# Patient Record
Sex: Female | Born: 1950 | Race: Black or African American | Hispanic: No | Marital: Single
Health system: Midwestern US, Community
[De-identification: ages and names within clinical notes are randomized; demographics above are authoritative.]

## PROBLEM LIST (undated history)

## (undated) DIAGNOSIS — R918 Other nonspecific abnormal finding of lung field: Principal | ICD-10-CM

## (undated) DIAGNOSIS — I10 Essential (primary) hypertension: Secondary | ICD-10-CM

## (undated) DIAGNOSIS — F419 Anxiety disorder, unspecified: Secondary | ICD-10-CM

## (undated) DIAGNOSIS — K449 Diaphragmatic hernia without obstruction or gangrene: Secondary | ICD-10-CM

## (undated) DIAGNOSIS — K219 Gastro-esophageal reflux disease without esophagitis: Secondary | ICD-10-CM

## (undated) DIAGNOSIS — F410 Panic disorder [episodic paroxysmal anxiety] without agoraphobia: Secondary | ICD-10-CM

## (undated) DIAGNOSIS — D509 Iron deficiency anemia, unspecified: Secondary | ICD-10-CM

## (undated) DIAGNOSIS — IMO0002 Reserved for concepts with insufficient information to code with codable children: Secondary | ICD-10-CM

## (undated) DIAGNOSIS — C801 Malignant (primary) neoplasm, unspecified: Secondary | ICD-10-CM

## (undated) HISTORY — DX: Gastro-esophageal reflux disease without esophagitis: K21.9

## (undated) HISTORY — DX: Diaphragmatic hernia without obstruction or gangrene: K44.9

## (undated) HISTORY — DX: Essential (primary) hypertension: I10

## (undated) HISTORY — DX: Reserved for concepts with insufficient information to code with codable children: IMO0002

## (undated) HISTORY — DX: Iron deficiency anemia, unspecified: D50.9

---

## 1993-08-15 HISTORY — PX: ABDOMINAL HYSTERECTOMY: SHX81

## 2003-08-16 HISTORY — PX: APPENDECTOMY: SHX54

## 2003-08-16 HISTORY — PX: PARTIAL COLECTOMY: SHX5273

## 2003-08-16 HISTORY — PX: CHOLECYSTECTOMY: SHX55

## 2009-04-11 LAB — CBC WITH AUTOMATED DIFF
ABS. EOSINOPHILS: 0.1 10*3/uL (ref 0.0–0.4)
ABS. LYMPHOCYTES: 2.3 10*3/uL (ref 0.8–3.5)
ABS. MONOCYTES: 0.5 10*3/uL (ref 0–1.0)
ABS. NEUTROPHILS: 6.9 10*3/uL (ref 1.8–8.0)
BASOPHILS: 0 % (ref 0–3)
EOSINOPHILS: 1 % (ref 0–5)
HCT: 32.4 % — ABNORMAL LOW (ref 36.0–46.0)
HGB: 10.1 g/dL — ABNORMAL LOW (ref 12.0–16.0)
LYMPHOCYTES: 24 % (ref 20–51)
MCH: 24.1 PG — ABNORMAL LOW (ref 25.0–35.0)
MCHC: 31.3 g/dL (ref 31.0–37.0)
MCV: 77.1 FL — ABNORMAL LOW (ref 78.0–102.0)
MONOCYTES: 5 % (ref 2–9)
MPV: 7.5 FL (ref 7.4–10.4)
NEUTROPHILS: 70 % (ref 42–75)
PLATELET: 384 10*3/uL (ref 130–400)
RBC: 4.2 M/uL (ref 4.10–5.10)
RDW: 17.1 % — ABNORMAL HIGH (ref 11.5–14.5)
WBC: 9.9 10*3/uL (ref 4.5–13.0)

## 2009-04-11 LAB — GLUCOSE, RANDOM: Glucose: 94 MG/DL (ref 74–99)

## 2011-05-07 ENCOUNTER — Emergency Department (HOSPITAL_COMMUNITY)
Admission: EM | Admit: 2011-05-07 | Discharge: 2011-05-07 | Disposition: A | Discharge status: Home / Self Care | Payer: Medicare Other | Attending: Emergency Medicine | Admitting: Emergency Medicine

## 2011-05-07 DIAGNOSIS — I10 Essential (primary) hypertension: Secondary | ICD-10-CM | POA: Insufficient documentation

## 2011-05-07 DIAGNOSIS — M26609 Unspecified temporomandibular joint disorder, unspecified side: Secondary | ICD-10-CM | POA: Insufficient documentation

## 2011-05-17 ENCOUNTER — Encounter: Payer: Medicare Other | Admitting: Oncology

## 2011-06-13 ENCOUNTER — Encounter (HOSPITAL_BASED_OUTPATIENT_CLINIC_OR_DEPARTMENT_OTHER): Payer: Medicare Other | Admitting: Oncology

## 2011-06-13 ENCOUNTER — Other Ambulatory Visit: Payer: Self-pay | Admitting: Oncology

## 2011-06-13 DIAGNOSIS — C3490 Malignant neoplasm of unspecified part of unspecified bronchus or lung: Secondary | ICD-10-CM

## 2011-06-13 DIAGNOSIS — C7800 Secondary malignant neoplasm of unspecified lung: Secondary | ICD-10-CM

## 2011-06-13 DIAGNOSIS — C801 Malignant (primary) neoplasm, unspecified: Secondary | ICD-10-CM

## 2011-06-14 ENCOUNTER — Telehealth: Payer: Self-pay | Admitting: *Deleted

## 2011-08-22 ENCOUNTER — Telehealth: Payer: Self-pay | Admitting: Oncology

## 2011-08-22 NOTE — Telephone Encounter (Signed)
Pt called to state that she is unable to pick up the oraL CONTRAST prior to the exam. Advised the pt that she will need to arrive two hrs earlier to drink the oral contrast.

## 2011-08-25 ENCOUNTER — Encounter (HOSPITAL_COMMUNITY): Payer: Self-pay

## 2011-08-25 ENCOUNTER — Other Ambulatory Visit: Payer: Self-pay | Admitting: Oncology

## 2011-08-25 ENCOUNTER — Ambulatory Visit (HOSPITAL_COMMUNITY)
Admission: RE | Admit: 2011-08-25 | Discharge: 2011-08-25 | Disposition: A | Discharge status: Home / Self Care | Payer: Medicare Other | Source: Ambulatory Visit | Attending: Oncology | Admitting: Oncology

## 2011-08-25 ENCOUNTER — Other Ambulatory Visit (HOSPITAL_BASED_OUTPATIENT_CLINIC_OR_DEPARTMENT_OTHER): Payer: Medicare Other | Admitting: Lab

## 2011-08-25 DIAGNOSIS — D3500 Benign neoplasm of unspecified adrenal gland: Secondary | ICD-10-CM | POA: Insufficient documentation

## 2011-08-25 DIAGNOSIS — C3410 Malignant neoplasm of upper lobe, unspecified bronchus or lung: Secondary | ICD-10-CM

## 2011-08-25 DIAGNOSIS — Z9089 Acquired absence of other organs: Secondary | ICD-10-CM | POA: Insufficient documentation

## 2011-08-25 DIAGNOSIS — N289 Disorder of kidney and ureter, unspecified: Secondary | ICD-10-CM | POA: Insufficient documentation

## 2011-08-25 DIAGNOSIS — K449 Diaphragmatic hernia without obstruction or gangrene: Secondary | ICD-10-CM | POA: Insufficient documentation

## 2011-08-25 DIAGNOSIS — C3490 Malignant neoplasm of unspecified part of unspecified bronchus or lung: Secondary | ICD-10-CM | POA: Insufficient documentation

## 2011-08-25 HISTORY — DX: Malignant (primary) neoplasm, unspecified: C80.1

## 2011-08-25 LAB — CMP (CANCER CENTER ONLY)
AST: 14 U/L (ref 11–38)
Alkaline Phosphatase: 106 U/L — ABNORMAL HIGH (ref 26–84)
BUN, Bld: 10 mg/dL (ref 7–22)
Creat: 0.8 mg/dl (ref 0.6–1.2)

## 2011-08-25 MED ORDER — IOHEXOL 300 MG/ML  SOLN
100.0000 mL | Freq: Once | INTRAMUSCULAR | Status: AC | PRN
  Administered 2011-08-25: 100 mL via INTRAVENOUS

## 2011-08-31 ENCOUNTER — Encounter: Payer: Self-pay | Admitting: *Deleted

## 2011-09-01 ENCOUNTER — Ambulatory Visit (HOSPITAL_BASED_OUTPATIENT_CLINIC_OR_DEPARTMENT_OTHER): Payer: Medicare Other | Admitting: Oncology

## 2011-09-01 ENCOUNTER — Other Ambulatory Visit: Payer: Medicare Other | Admitting: Lab

## 2011-09-01 VITALS — BP 168/94 | HR 99 | Temp 97.3°F | Ht 66.0 in | Wt 209.1 lb

## 2011-09-01 DIAGNOSIS — C7800 Secondary malignant neoplasm of unspecified lung: Secondary | ICD-10-CM

## 2011-09-01 DIAGNOSIS — D509 Iron deficiency anemia, unspecified: Secondary | ICD-10-CM

## 2011-09-01 DIAGNOSIS — R42 Dizziness and giddiness: Secondary | ICD-10-CM

## 2011-09-01 DIAGNOSIS — C801 Malignant (primary) neoplasm, unspecified: Secondary | ICD-10-CM

## 2011-09-01 DIAGNOSIS — C7A1 Malignant poorly differentiated neuroendocrine tumors: Secondary | ICD-10-CM

## 2011-09-01 NOTE — Progress Notes (Signed)
OFFICE PROGRESS NOTE   INTERVAL HISTORY:   She returns as scheduled. She denies fever, shortness of breath, and cough. She has occasional diarrhea but this is not a consistent symptom. Her chief complaint is "vertigo ". This has been a problem for years. She reports undergoing an evaluation that included brain imaging in the past.  Objective:  Vital signs in last 24 hours:  Blood pressure 168/94, pulse 99, temperature 97.3 F (36.3 C), temperature source Oral, height 5\' 6"  (1.676 m), weight 209 lb 1.6 oz (94.847 kg).    HEENT: Neck without mass Lymphatics: No cervical, supraclavicular, axillary, or inguinal nodes Resp: Lungs clear bilaterally Cardio: Regular rate and rhythm GI: No hepatosplenomegaly, no mass Vascular: No leg edema    X-rays: CT scans of the chest abdomen and pelvis on 08/25/2011 revealed innumerable pulmonary nodules bilaterally consistent with metastatic disease. The largest nodule measured 13 mm in the left lower lobe and the largest nodule on the right lung measured 6 mm. No lymphadenopathy. 2 indeterminate left renal lesions were seen. No other evidence of metastatic disease.   Medications: I have reviewed the patient's current medications.  Assessment/Plan: 1. Metastatic neuroendocrine carcinoma involving lung nodules.  Diagnosed in July of 2011. a. Restaging CT evaluation at [facility] on 11/23/2010 revealed no change in the bilateral lung nodules compared to a CT from 01/13/2010 with no evidence of progressive metastatic disease. b. Restaging CT evaluation 08/25/2011 confirmed bilateral lung nodules and no additional evidence of metastatic disease. 2. History of a "colon polyp" resected in 2005. 3.     History of iron-deficiency anemia 4.     chronic vertigo 5.      small indeterminate left renal lesions on the CT 08/25/2011.  Disposition:  She remains asymptomatic from the metastatic neuroendocrine carcinoma. The plan is to continue an observation approach.  We've requested a direct comparison of current CT with the due to study from April of 2012. This was not done and we will make another request for a direct comparison.  She will return for an office visit in 3 months.   Lucile Shutters, MD  09/01/2011  6:13 PM

## 2011-09-01 NOTE — Progress Notes (Signed)
Called radiology file room to obtain CD of 11/23/10 CT scan from [facility] to compare with [facility] scan done on 08/25/11 and document comparison addendum report.

## 2011-09-05 ENCOUNTER — Telehealth: Payer: Self-pay | Admitting: Oncology

## 2011-09-05 NOTE — Telephone Encounter (Signed)
Faxed 09/01/11 office note to Dr. Lattie Corns at [facility]

## 2011-09-14 ENCOUNTER — Telehealth: Payer: Self-pay | Admitting: *Deleted

## 2011-09-14 NOTE — Telephone Encounter (Signed)
Patient notified. She requests copy of report and labs be mailed to her home.

## 2011-09-14 NOTE — Telephone Encounter (Signed)
Message copied by Wandalee Ferdinand on Wed Sep 14, 2011  7:00 PM ------      Message from: Ladene Artist      Created: Sun Sep 11, 2011  2:34 PM       Please call patient, lung nodules are stable or smaller compared to [facility] CT. Be sure we have report of [facility] CT.      F/u as scheduled

## 2011-12-01 ENCOUNTER — Telehealth: Payer: Self-pay | Admitting: Oncology

## 2011-12-01 ENCOUNTER — Ambulatory Visit (HOSPITAL_BASED_OUTPATIENT_CLINIC_OR_DEPARTMENT_OTHER): Payer: Medicare Other | Admitting: Oncology

## 2011-12-01 VITALS — BP 159/88 | HR 101 | Temp 98.0°F | Ht 66.0 in | Wt 213.6 lb

## 2011-12-01 DIAGNOSIS — D3A8 Other benign neuroendocrine tumors: Secondary | ICD-10-CM

## 2011-12-01 DIAGNOSIS — D3A00 Benign carcinoid tumor of unspecified site: Secondary | ICD-10-CM

## 2011-12-01 NOTE — Telephone Encounter (Signed)
appts made and printed for pt aom °

## 2011-12-01 NOTE — Progress Notes (Signed)
OFFICE PROGRESS NOTE   INTERVAL HISTORY:   She returns as scheduled. She reports recent productive cough and allergies. No fever, night sweats, flushing, or anorexia. Stable exertional dyspnea.  Objective:  Vital signs in last 24 hours:  Blood pressure 159/88, pulse 101, temperature 98 F (36.7 C), temperature source Oral, height 5\' 6"  (1.676 m), weight 213 lb 9.6 oz (96.888 kg).    HEENT: Neck without mass Lymphatics: No cervical, supraclavicular, axillary, or inguinal nodes Resp: Lungs clear bilaterally, no respiratory distress Cardio: Regular rate and rhythm GI: No hepatosplenic the, no mass, nontender Vascular: No leg edema  Lab Results: Pancreatic polypeptide-1351 on 08/25/2011    Medications: I have reviewed the patient's current medications.  Assessment/Plan: 1. Metastatic neuroendocrine carcinoma involving lung nodules.   a. Restaging CT evaluation at [facility] on 11/23/2010 revealed no change in the bilateral lung nodules compared to a CT from 01/13/2010 with no evidence of progressive metastatic disease. b. Restaging CT 09/02/2011, compared to the [facility] study from 11/23/2010 revealed a decrease in the size of some of the pulmonary nodules. An upper pole left renal lesion is unchanged, a lower indeterminate left renal lesion was not imaged on the previous study. 2. History of a "colon polyp" resected in 2005. 3. History of iron-deficiency anemia.    Disposition:  There is no clinical evidence for progression of the metastatic neuroendocrine tumor. The restaging CT in January of 2013 revealed no evidence for progression of the lung nodules. She will return for an opposite visit and repeat pancreatic polypeptide level in 4 months. We will plan a restaging CT evaluation at a one-year interval.   Thornton Papas, MD  12/01/2011  1:35 PM

## 2012-01-24 ENCOUNTER — Emergency Department (HOSPITAL_COMMUNITY)
Admission: EM | Admit: 2012-01-24 | Discharge: 2012-01-24 | Disposition: A | Discharge status: Home / Self Care | Payer: Medicare Other | Attending: Emergency Medicine | Admitting: Emergency Medicine

## 2012-01-24 ENCOUNTER — Emergency Department (HOSPITAL_COMMUNITY): Payer: Medicare Other

## 2012-01-24 ENCOUNTER — Encounter (HOSPITAL_COMMUNITY): Payer: Self-pay | Admitting: *Deleted

## 2012-01-24 DIAGNOSIS — C7A00 Malignant carcinoid tumor of unspecified site: Secondary | ICD-10-CM | POA: Insufficient documentation

## 2012-01-24 DIAGNOSIS — K449 Diaphragmatic hernia without obstruction or gangrene: Secondary | ICD-10-CM | POA: Insufficient documentation

## 2012-01-24 DIAGNOSIS — R0602 Shortness of breath: Secondary | ICD-10-CM | POA: Insufficient documentation

## 2012-01-24 DIAGNOSIS — R000 Tachycardia, unspecified: Secondary | ICD-10-CM | POA: Insufficient documentation

## 2012-01-24 DIAGNOSIS — C7B8 Other secondary neuroendocrine tumors: Secondary | ICD-10-CM | POA: Insufficient documentation

## 2012-01-24 DIAGNOSIS — I10 Essential (primary) hypertension: Secondary | ICD-10-CM | POA: Insufficient documentation

## 2012-01-24 DIAGNOSIS — R55 Syncope and collapse: Secondary | ICD-10-CM | POA: Insufficient documentation

## 2012-01-24 LAB — BASIC METABOLIC PANEL
CO2: 28 mEq/L (ref 19–32)
Calcium: 9.3 mg/dL (ref 8.4–10.5)
Creatinine, Ser: 0.91 mg/dL (ref 0.50–1.10)
GFR calc non Af Amer: 66 mL/min — ABNORMAL LOW (ref >90)
Glucose, Bld: 91 mg/dL (ref 70–99)
Sodium: 137 mEq/L (ref 135–145)

## 2012-01-24 LAB — CBC
MCH: 25.5 pg — ABNORMAL LOW (ref 26.0–34.0)
MCHC: 31.1 g/dL (ref 30.0–36.0)
MCV: 82.3 fL (ref 78.0–100.0)
Platelets: 404 10*3/uL — ABNORMAL HIGH (ref 150–400)
RBC: 4.62 MIL/uL (ref 3.87–5.11)

## 2012-01-24 MED ORDER — SODIUM CHLORIDE 0.9 % IV BOLUS (SEPSIS)
1000.0000 mL | Freq: Once | INTRAVENOUS | Status: AC
  Administered 2012-01-24: 1000 mL via INTRAVENOUS

## 2012-01-24 MED ORDER — IOHEXOL 300 MG/ML  SOLN
100.0000 mL | Freq: Once | INTRAMUSCULAR | Status: AC | PRN
  Administered 2012-01-24: 80 mL via INTRAVENOUS

## 2012-01-24 NOTE — ED Notes (Signed)
WUJ:[id number]<JY> Expected date:<BR> Expected time: 3:40 PM<BR> Means of arrival:<BR> Comments:<BR> M10 - 61yoF Near syncope, says its related to vertigo

## 2012-01-24 NOTE — Discharge Instructions (Signed)
Near-Syncope Near-syncope is sudden weakness, dizziness, or feeling like you might pass out (faint). This may occur when getting up after sitting or while standing for a long period of time. Near-syncope can be caused by a drop in blood pressure. This is a common reaction, but it may occur to a greater degree in people taking medicines to control their blood pressure. Fainting often occurs when the blood pressure or pulse is too low to provide enough blood flow to the brain to keep you conscious. Fainting and near-syncope are not usually due to serious medical problems. However, certain people should be more cautious in the event of near-syncope, including elderly patients, patients with diabetes, and patients with a history of heart conditions (especially irregular rhythms).  CAUSES   Drop in blood pressure.   Physical pain.   Dehydration.   Heat exhaustion.   Emotional distress.   Low blood sugar.   Internal bleeding.   Heart and circulatory problems.   Infections.  SYMPTOMS   Dizziness.   Feeling sick to your stomach (nauseous).   Nearly fainting.   Body numbness.   Turning pale.   Tunnel vision.   Weakness.  HOME CARE INSTRUCTIONS   Lie down right away if you start feeling like you might faint. Breathe deeply and steadily. Wait until all the symptoms have passed. Most of these episodes last only a few minutes. You may feel tired for several hours.   Drink enough fluids to keep your urine clear or pale yellow.   If you are taking blood pressure or heart medicine, get up slowly, taking several minutes to sit and then stand. This can reduce dizziness that is caused by a drop in blood pressure.  SEEK IMMEDIATE MEDICAL CARE IF:   You have a severe headache.   Unusual pain develops in the chest, abdomen, or back.   There is bleeding from the mouth or rectum, or you have black or tarry stool.   An irregular heartbeat or a very rapid pulse develops.   You have  repeated fainting or seizure-like jerking during an episode.   You faint when sitting or lying down.   You develop confusion.   You have difficulty walking.   Severe weakness develops.   Vision problems develop.  MAKE SURE YOU:   Understand these instructions.   Will watch your condition.   Will get help right away if you are not doing well or get worse.  Document Released: 08/01/2005 Document Revised: 07/21/2011 Document Reviewed: 09/17/2010 [facility] Patient Information 2012 [place], [place].  RESOURCE GUIDE  Dental Problems  Patients with Medicaid: [facility] [id number]                                           [phone] [address] Phone:  [phone]                                                   Phone:  [phone]  If unable to pay or uninsured, contact:  [facility] or [facility]. to become qualified for the adult dental clinic.  Chronic Pain Problems Contact [facility]  [phone] Patients need to be referred by their primary care doctor.  Insufficient Money for Medicine Contact United Way:  call "211" or [facility] [phone].  No Primary Care Doctor Call [facility]  [phone] Other agencies that provide inexpensive medical care    [facility]  [phone]    [facility]  [phone]    [facility]  [phone]    [facility]  [phone]    [facility]  [phone]    [facility]  [phone]  Psychological Services [facility]  [phone] [facility]  [phone] [facility]   [phone] (emergency services [phone])  Abuse/Neglect [facility] Child Abuse Hotline 8634666338 [facility] Child Abuse Hotline (769)643-4672 (After Hours)  Emergency Shelter [facility] [phone]  Maternity Homes Room at the [place] [facility] [place] [phone] [facility] [phone]  MRSA Hotline #:   724-423-7403    [facility] Resources  [facility] [place]  United [employer]                           [facility] Dept. [address] [place]                     [address]         [address]  Blondell Reveal Phone:  [phone]                                  Phone:  [phone]                   Phone:  [phone]  [facility] Phone:  [phone]  [facility] Child Abuse Hotline [phone] 7195685159 (After Hours)

## 2012-01-24 NOTE — ED Notes (Signed)
Per [facility], pt from work with reports of near syncope after bending over to pick something up off floor, became dizzy and tunnel vision that cleared during transport. Per EMS pt endorses hx of vertigo and Stage IV Lung Cancer but has not taken chemo or radiation.

## 2012-01-24 NOTE — ED Notes (Signed)
EKG given to Dr Hyman Hopes.

## 2012-01-24 NOTE — ED Notes (Signed)
Patient returned from X-ray 

## 2012-01-24 NOTE — ED Notes (Signed)
Ambulated in hall and to the bathroom---tolerated well, denies dizziness or nausea.

## 2012-01-24 NOTE — ED Provider Notes (Signed)
History     CSN: [id number]  Arrival date & time 01/24/12  1551   First MD Initiated Contact with Patient 01/24/12 1725      Chief Complaint  Patient presents with  . Near Syncope    (Consider location/radiation/quality/duration/timing/severity/associated sxs/prior treatment) HPI  61yoF h/o metastatic neuroendocrine tumor b/l lung not currently undergoing treatment, hypertension, GERD presents with near syncopal episode. The patient states she was sitting in a chair when she suddenly became lightheaded after bending over. She states that the room was becoming dark and she felt that she was going to pass out at that time. She also felt palpitations in her heart was racing. Her son took her heart rate is 120 at that time. She denies headache. She states she feels generally fatigued and weak and not completely back to her baseline but there is no lightheadedness at this time. She denies change in vision. She denies chest pain, shortness of breath. No history of venous thromboembolism in the past. Denies history of coronary artery disease. Denies hematuria/dysuria/freq/urgency    ED Notes, ED Provider Notes from 01/24/12 0000 to 01/24/12 16:05:35       Alvina Chou, RN 01/24/2012 16:04      Per [facility], pt from work with reports of near syncope after bending over to pick something up off floor, became dizzy and tunnel vision that cleared during transport. Per EMS pt endorses hx of vertigo and Stage IV Lung Cancer but has not taken chemo or radiation.         Alvina Chou, RN 01/24/2012 16:00      GEX:[id number]  Expected date:  Expected time: 3:40 PM  Means of arrival:  Comments:  M10 - 61yoF Near syncope, says its related to vertigo     Past Medical History  Diagnosis Date  . lung ca     lung ca  . Iron deficiency anemia   . Hypertension   . GERD (gastroesophageal reflux disease)   . Hiatal hernia   . Positional vertigo     Past Surgical History  Procedure Date  .  Abdominal hysterectomy 1995  . Cholecystectomy 2005  . Appendectomy 2005  . Partial colectomy 2005    History reviewed. No pertinent family history.  History  Substance Use Topics  . Smoking status: Not on file  . Smokeless tobacco: Not on file  . Alcohol Use: Not on file    OB History    Grav Para Term Preterm Abortions TAB SAB Ect Mult Living                  Review of Systems  All other systems reviewed and are negative.   except as noted HPI   Allergies  Pineapple  Home Medications   Current Outpatient Rx  Name Route Sig Dispense Refill  . AMLODIPINE BESYLATE 2.5 MG PO TABS Oral Take 2.5 mg by mouth daily.    Marland Kitchen ESTROGENS CONJUGATED 0.625 MG PO TABS Oral Take 0.625 mg by mouth daily.     Marland Kitchen LORAZEPAM 1 MG PO TABS Oral Take 1 mg by mouth daily as needed.    Marland Kitchen MECLIZINE HCL 25 MG PO TABS Oral Take 25 mg by mouth 3 (three) times daily as needed.    Marland Kitchen METRONIDAZOLE 500 MG PO TABS Oral Take 500 mg by mouth 2 (two) times daily.    Marland Kitchen OMEPRAZOLE 20 MG PO CPDR Oral Take 20 mg by mouth daily.    Marland Kitchen VITAMIN D (ERGOCALCIFEROL)  50000 UNITS PO CAPS Oral Take 50,000 Units by mouth every 7 (seven) days.      BP 149/65  Pulse 90  Temp(Src) 97.4 F (36.3 C) (Oral)  Resp 18  SpO2 99%  Physical Exam  Nursing note and vitals reviewed. Constitutional: She is oriented to person, place, and time. She appears well-developed.  HENT:  Head: Atraumatic.  Mouth/Throat: Oropharynx is clear and moist.  Eyes: Conjunctivae and EOM are normal. Pupils are equal, round, and reactive to light.  Neck: Normal range of motion. Neck supple.  Cardiovascular: Normal rate, regular rhythm, normal heart sounds and intact distal pulses.        tachycardic  Pulmonary/Chest: Effort normal and breath sounds normal. No respiratory distress. She has no wheezes. She has no rales.  Abdominal: Soft. She exhibits no distension. There is no tenderness. There is no rebound and no guarding.  Musculoskeletal:  Normal range of motion.  Neurological: She is alert and oriented to person, place, and time. No cranial nerve deficit. Coordination normal.       Strength 5/5 all extremities No pronator drift No facial droop   Skin: Skin is warm and dry. No rash noted.  Psychiatric: She has a normal mood and affect.    Date: 01/24/2012  Rate: 106  Rhythm: sinus tachycardia  QRS Axis: normal  Intervals: normal  ST/T Wave abnormalities: normal  Conduction Disutrbances:none  Narrative Interpretation:   Old EKG Reviewed: none available    ED Course  Procedures (including critical care time)  Labs Reviewed  CBC - Abnormal; Notable for the following:    WBC 12.1 (*)    Hemoglobin 11.8 (*)    MCH 25.5 (*)    Platelets 404 (*)    All other components within normal limits  BASIC METABOLIC PANEL - Abnormal; Notable for the following:    GFR calc non Af Amer 66 (*)    GFR calc Af Amer 77 (*)    All other components within normal limits   Dg Chest 2 View  01/24/2012  *RADIOLOGY REPORT*  Clinical Data: Near-syncope, shortness of breath, history of lung carcinoma  CHEST - 2 VIEW  Comparison: CT chest of 08/25/2011  Findings: There are poorly defined lung nodules bilaterally consistent with metastases as demonstrated on prior CT.  There is some volume loss at the right lung base with elevation of the right hemidiaphragm.  No effusion is seen.  Mild cardiomegaly is stable as is a moderate sized hiatal hernia.  No bony abnormality is seen.  IMPRESSION: Bilateral lung nodules consistent with metastases as previously demonstrated.  Right basilar atelectasis.  Original Report Authenticated By: Juline Patch, M.D.   Ct Head Wo Contrast  01/24/2012  *RADIOLOGY REPORT*  Clinical Data: near syncope  CT HEAD WITHOUT CONTRAST  Technique:  Contiguous axial images were obtained from the base of the skull through the vertex without contrast.  Comparison: None.  Findings:  There is diffuse patchy low density throughout  the subcortical and periventricular white matter consistent with chronic small vessel ischemic change.  There is prominence of the sulci and ventricles consistent with brain atrophy.  There is no evidence for acute brain infarct, hemorrhage or mass.  The paranasal sinuses and mastoid air cells are clear.  The skull appears intact.  IMPRESSION:  1.  No acute intracranial abnormalities. 2.  Small vessel ischemic change and brain atrophy.  Original Report Authenticated By: Rosealee Albee, M.D.   Ct Angio Chest W/cm &/or Wo Cm  01/24/2012  *RADIOLOGY REPORT*  Clinical Data: Near syncope.  History of metastatic narrow endocrine cell carcinoma.  Evaluate for pulmonary embolism.  CT ANGIOGRAPHY CHEST  Technique:  Multidetector CT imaging of the chest using the standard protocol during bolus administration of intravenous contrast. Multiplanar reconstructed images including MIPs were obtained and reviewed to evaluate the vascular anatomy.  Contrast: 80mL OMNIPAQUE IOHEXOL 300 MG/ML  SOLN  Comparison: Chest CT 08/25/2011.  Findings:  Mediastinum: There are no filling defects within the pulmonary arterial tree to suggest underlying pulmonary embolism. Heart size is normal. There is no significant pericardial fluid, thickening or pericardial calcification.  There is a large hiatal hernia. No pathologically enlarged mediastinal or hilar lymph nodes. No acute abnormality of the thoracic aorta; specifically, no aneurysm or dissection.  Lungs/Pleura: Innumerable pulmonary nodules are again seen scattered throughout the lungs bilaterally.  Many of these appear similar in size, number and distribution, including the largest nodule noted on the prior examination in the lateral aspect of the left lower lobe (image 55 of series 11) which measures 13 mm.  Some of these nodules appear cavitary and demonstrate thick walls.  In addition, there are a few new nodules, including what is now the largest lesion which measures 15 mm in  diameter (image 64 of series 11) which appears to be partially cavitary.  No acute consolidative airspace disease.  No pleural effusions.  Chronic atelectasis or scarring is noted in the left lower lobe and lateral segment of the right middle lobe.  Upper Abdomen: An exophytic low attenuation lesion extending off the upper pole of the left kidney measures at least 13 mm in diameter (similar to prior).  Musculoskeletal: There are no aggressive appearing lytic or blastic lesions noted in the visualized portions of the skeleton.  IMPRESSION: 1.  No evidence of pulmonary embolism. 2.  No acute findings in the thorax to account for the patient's symptoms. 3.  Innumerable pulmonary nodules scattered throughout the lungs bilaterally generally appear similar to the prior examination, with some exceptions as above, including a new 15 mm partially cavitary nodule in the right lower lobe posteriorly.    Per report, this represents metastatic neuroendocrine cell carcinoma.  4.  Large hiatal hernia. 5.  Additional incidental findings, as above.  Original Report Authenticated By: Florencia Reasons, M.D.     1. Near syncope       MDM  History of lung cancer presents with near syncopal episode. She is persistently tachycardic in the emergency department. Plan for CT head rule out metastasis, CT angio chest rule out pulmonary embolism, labs, IV fluids, reassess.  She states that she is feeling back to baseline. She is ambulatory without lightheadedness. CT angiogram chest without pulmonary embolism. CT head without metastasis. She is eating and feels well, ready for discharge home. She's been history precautions for return. She has a followup appointment with her primary care doctor 1 week and will call Dr. Truett Perna as needed.         Forbes Cellar, MD 01/24/12 2220

## 2012-02-05 ENCOUNTER — Emergency Department (HOSPITAL_COMMUNITY): Payer: Medicare Other

## 2012-02-05 ENCOUNTER — Encounter (HOSPITAL_COMMUNITY): Payer: Self-pay | Admitting: *Deleted

## 2012-02-05 ENCOUNTER — Emergency Department (HOSPITAL_COMMUNITY)
Admission: EM | Admit: 2012-02-05 | Discharge: 2012-02-06 | Disposition: A | Discharge status: Home / Self Care | Payer: Medicare Other | Attending: Emergency Medicine | Admitting: Emergency Medicine

## 2012-02-05 DIAGNOSIS — R5383 Other fatigue: Secondary | ICD-10-CM | POA: Insufficient documentation

## 2012-02-05 DIAGNOSIS — K219 Gastro-esophageal reflux disease without esophagitis: Secondary | ICD-10-CM | POA: Insufficient documentation

## 2012-02-05 DIAGNOSIS — R110 Nausea: Secondary | ICD-10-CM | POA: Insufficient documentation

## 2012-02-05 DIAGNOSIS — R5381 Other malaise: Secondary | ICD-10-CM | POA: Insufficient documentation

## 2012-02-05 DIAGNOSIS — R42 Dizziness and giddiness: Secondary | ICD-10-CM | POA: Insufficient documentation

## 2012-02-05 DIAGNOSIS — C3490 Malignant neoplasm of unspecified part of unspecified bronchus or lung: Secondary | ICD-10-CM | POA: Insufficient documentation

## 2012-02-05 DIAGNOSIS — I10 Essential (primary) hypertension: Secondary | ICD-10-CM | POA: Insufficient documentation

## 2012-02-05 DIAGNOSIS — Z79899 Other long term (current) drug therapy: Secondary | ICD-10-CM | POA: Insufficient documentation

## 2012-02-05 DIAGNOSIS — R55 Syncope and collapse: Secondary | ICD-10-CM | POA: Insufficient documentation

## 2012-02-05 DIAGNOSIS — R000 Tachycardia, unspecified: Secondary | ICD-10-CM | POA: Insufficient documentation

## 2012-02-05 LAB — POCT I-STAT, CHEM 8
BUN: 5 mg/dL — ABNORMAL LOW (ref 6–23)
Calcium, Ion: 1.16 mmol/L (ref 1.12–1.32)
Creatinine, Ser: 0.9 mg/dL (ref 0.50–1.10)
Hemoglobin: 13.3 g/dL (ref 12.0–15.0)
TCO2: 25 mmol/L (ref 0–100)

## 2012-02-05 LAB — URINALYSIS, ROUTINE W REFLEX MICROSCOPIC
Glucose, UA: NEGATIVE mg/dL
Hgb urine dipstick: NEGATIVE
Ketones, ur: NEGATIVE mg/dL
Protein, ur: NEGATIVE mg/dL
Urobilinogen, UA: 0.2 mg/dL (ref 0.0–1.0)

## 2012-02-05 MED ORDER — IOHEXOL 350 MG/ML SOLN
100.0000 mL | Freq: Once | INTRAVENOUS | Status: AC | PRN
  Administered 2012-02-05: 100 mL via INTRAVENOUS

## 2012-02-05 NOTE — ED Provider Notes (Signed)
I saw and evaluated the patient, reviewed the resident's note and I agree with the findings and plan.   Cheri Guppy, MD 02/06/12 [phone]

## 2012-02-05 NOTE — ED Notes (Signed)
To ED via [facility] for eval of dizziness and weakness. Hx of vertigo. [place] was [place]. Nausea. No cp.

## 2012-02-05 NOTE — ED Provider Notes (Signed)
History     CSN: [id number]  Arrival date & time 02/05/12  1749   First MD Initiated Contact with Patient 02/05/12 1859      Chief Complaint  Patient presents with  . Dizziness    Patient is a 61 y.o. female presenting with weakness. The history is provided by the patient and a relative.  Weakness The primary symptoms include dizziness (episode of dizziness of similar quality but worsened quantity compared with baseline) and nausea. Primary symptoms do not include headaches, syncope (near-syncope), altered mental status, seizures, visual change, paresthesias, focal weakness, loss of sensation, speech change, memory loss, fever or vomiting. The symptoms began less than 1 hour ago. The episode lasted 1 hour. The symptoms are improving. The neurological symptoms are diffuse. Context: Right after eating.  Dizziness also occurs with nausea and weakness. Dizziness does not occur with vomiting.  Additional symptoms include weakness. Additional symptoms do not include neck stiffness, photophobia or vertigo (at baseline). Medical issues also include cancer. Medical issues do not include seizures or cerebral vascular accident.    Past Medical History  Diagnosis Date  . lung ca     lung ca  . Iron deficiency anemia   . Hypertension   . GERD (gastroesophageal reflux disease)   . Hiatal hernia   . Positional vertigo     Past Surgical History  Procedure Date  . Abdominal hysterectomy 1995  . Cholecystectomy 2005  . Appendectomy 2005  . Partial colectomy 2005    History reviewed. No pertinent family history.  History  Substance Use Topics  . Smoking status: Not on file  . Smokeless tobacco: Not on file  . Alcohol Use: Not on file    OB History    Grav Para Term Preterm Abortions TAB SAB Ect Mult Living                  Review of Systems  Constitutional: Negative for fever and chills.  HENT: Negative for neck pain and neck stiffness.   Eyes: Negative for photophobia.    Respiratory: Negative for cough, chest tightness, shortness of breath and wheezing.   Cardiovascular: Negative for chest pain, palpitations and syncope (near-syncope).  Gastrointestinal: Positive for nausea. Negative for vomiting, abdominal pain, diarrhea and constipation.  Genitourinary: Negative for dysuria and decreased urine volume.  Skin: Negative for rash and wound.  Neurological: Positive for dizziness (episode of dizziness of similar quality but worsened quantity compared with baseline), weakness and light-headedness. Negative for vertigo (at baseline), speech change, focal weakness, seizures, facial asymmetry, headaches and paresthesias. Syncope: near syncope.  Psychiatric/Behavioral: Negative for memory loss, confusion, agitation and altered mental status.  All other systems reviewed and are negative.    Allergies  Pineapple  Home Medications   Current Outpatient Rx  Name Route Sig Dispense Refill  . AMLODIPINE BESYLATE 2.5 MG PO TABS Oral Take 2.5 mg by mouth daily.    . CHOLECALCIFEROL 400 UNITS PO TABS Oral Take 400 Units by mouth daily.    Marland Kitchen ESTROGENS CONJUGATED 0.625 MG PO TABS Oral Take 0.625 mg by mouth daily.     Marland Kitchen LORAZEPAM 1 MG PO TABS Oral Take 1 mg by mouth daily as needed.    Marland Kitchen MECLIZINE HCL 25 MG PO TABS Oral Take 25 mg by mouth 3 (three) times daily as needed. For dizziness    . OMEPRAZOLE 20 MG PO CPDR Oral Take 20 mg by mouth daily.    Marland Kitchen METRONIDAZOLE 500 MG PO TABS Oral Take  500 mg by mouth 2 (two) times daily.      BP 152/86  Pulse 119  Temp 98.5 F (36.9 C) (Oral)  Resp 20  SpO2 100%  Physical Exam  Nursing note and vitals reviewed. Constitutional: She is oriented to person, place, and time. She appears well-developed and well-nourished.  HENT:  Head: Normocephalic and atraumatic.  Right Ear: External ear normal.  Left Ear: External ear normal.  Nose: Nose normal.  Mouth/Throat: Oropharynx is clear and moist. No oropharyngeal exudate.  Eyes:  Conjunctivae are normal. Pupils are equal, round, and reactive to light.  Neck: Normal range of motion. Neck supple.  Cardiovascular: Regular rhythm, normal heart sounds and intact distal pulses.  Exam reveals no gallop and no friction rub.   No murmur heard.      Tachycardia   Pulmonary/Chest: Effort normal and breath sounds normal. No respiratory distress. She has no wheezes. She has no rales. She exhibits no tenderness.  Abdominal: Soft. Bowel sounds are normal. She exhibits no distension and no mass. There is no tenderness. There is no rebound and no guarding.  Musculoskeletal: Normal range of motion. She exhibits no edema and no tenderness.  Neurological: She is alert and oriented to person, place, and time. She displays normal reflexes. No cranial nerve deficit. She exhibits normal muscle tone. Coordination normal.  Skin: Skin is warm and dry.  Psychiatric: She has a normal mood and affect. Her behavior is normal. Judgment and thought content normal.    ED Course  Procedures (including critical care time)  Labs Reviewed  POCT I-STAT, CHEM 8 - Abnormal; Notable for the following:    BUN 5 (*)     Glucose, Bld 115 (*)     All other components within normal limits  URINALYSIS, ROUTINE W REFLEX MICROSCOPIC   Ct Angio Chest W/cm &/or Wo Cm  02/05/2012  *RADIOLOGY REPORT*  Clinical Data: 61 year old female with new onset dizziness. History of metastatic narrow endocrine tumor.  CT ANGIOGRAPHY CHEST  Technique:  Multidetector CT imaging of the chest using the standard protocol during bolus administration of intravenous contrast. Multiplanar reconstructed images including MIPs were obtained and reviewed to evaluate the vascular anatomy.  Contrast: OMNIPAQUE IOHEXOL 350 MG/ML SOLN  Comparison: 01/24/2012 and earlier.  Findings: Suboptimal but adequate contrast bolus timing in the pulmonary arterial tree, similar to the recent comparison.  No central pulmonary artery filling defect.  Major  bilateral pulmonary artery branches appear normally opacified.  No evidence of acute pulmonary embolus identified.  Large hiatal hernia containing mostly mesenteric fat re-identified and stable.  No pericardial or pleural effusion.  Stable and negative visualized thoracic inlet.  Stable small mediastinal and hilar lymph nodes.  Stable visualized upper abdominal viscera including hepatic steatosis and exophytic left renal lesion.  Left adrenal myelolipoma again noted.  Major airways are patent.  Numerous bilateral pulmonary nodules with a lower lobe predominance are re-identified ranging from punctate to 15 mm diameter (the largest in the bilateral lower lobes ).  No significant interval change, some cavitation of the right lower lobe nodules is again noted (series 5 image 55). Superimposed chronic retrocardiac and lower lobe atelectasis is stable.  No acute osseous abnormality identified.  IMPRESSION: 1.  Suboptimal contrast bolus timing but no evidence of acute pulmonary embolism. 2.  Stable widespread pulmonary nodules compatible with metastatic disease to the lungs.  No significant change since 01/24/2012.  Original Report Authenticated By: Harley Hallmark, M.D.     1. 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 Patient does not have systemic signs/symptoms of infection. AFVSS. No localizing neuro signs on exam and no neuro complaints. No evidence of neurologic or cardiac causes of near syncope. Because patient has resting tachycardia and risk factors for PE, chest CTA ordered and negative for evidence of PE. Patient states she is feeling better and given return precautions, including worsening of signs or symptoms. Patient instructed to follow-up with primary care physician.          Clemetine Marker, MD 02/06/12 [phone]

## 2012-02-05 NOTE — ED Notes (Signed)
Error charting wrong pt disregard care handoff

## 2012-02-05 NOTE — Discharge Instructions (Signed)
Your CAT scan does not show evidence of a blood clot in your lungs.  Your blood tests, not show any signs of anemia.  At this time.  Followup with your Dr. for reevaluation.  If your symptoms persist.  Return for worse or uncontrolled symptoms

## 2012-02-05 NOTE — ED Notes (Signed)
Pt complains of dizziness, pt sts that she was at dinner theatre and became dizzy, pt has a history of vertigo and sts that this feels the same but more intense.

## 2012-02-05 NOTE — ED Notes (Signed)
MD in the spoke to pt discharge on hold for now

## 2012-02-06 ENCOUNTER — Telehealth: Payer: Self-pay | Admitting: *Deleted

## 2012-02-06 NOTE — Telephone Encounter (Signed)
Returned call to pt. Per Dr. Truett Perna: It is not likely that her cancer diagnosis is causing hypertension or dizziness. Follow up with Primary Care MD for this. Pt states she has never been told she had low platelet count before.

## 2012-02-06 NOTE — Telephone Encounter (Signed)
Recent ER visit for "dizzy spells" and hypertension. Asking how much her neuroendocrine tumor cancer diagnosis could contribute to this?

## 2012-02-10 ENCOUNTER — Telehealth: Payer: Self-pay | Admitting: *Deleted

## 2012-02-10 NOTE — Telephone Encounter (Signed)
Platelet count 6/11=404 and labs faxed from [facility] 02/01/12 have platelets at 68. Left VM for patient to call and let office know if she is having any chemotherapy per [facility] for her cancer to explain drop or if it could be lab error.  Also MD asking what specific information she is requesting in the letter she has requested he dictate.

## 2012-02-17 ENCOUNTER — Other Ambulatory Visit: Payer: Self-pay | Admitting: *Deleted

## 2012-02-17 ENCOUNTER — Telehealth: Payer: Self-pay | Admitting: *Deleted

## 2012-02-17 DIAGNOSIS — C801 Malignant (primary) neoplasm, unspecified: Secondary | ICD-10-CM

## 2012-02-17 NOTE — Telephone Encounter (Signed)
Labs from PCP showed platelet count much lower than she has been in this office. Needs to recheck per Dr. Truett Perna. She agrees to come in on 7/9 for recheck. Unable to come Monday due to transportation. Denies any bleeding or bruising. Also made her aware that Dr. Truett Perna doubts that her dizzy spells and hypertension are related to her neuroendocrine cancer. Suggests she follow up with her PCP about this issue.

## 2012-02-21 ENCOUNTER — Other Ambulatory Visit (HOSPITAL_BASED_OUTPATIENT_CLINIC_OR_DEPARTMENT_OTHER): Payer: Medicare Other | Admitting: Lab

## 2012-02-21 ENCOUNTER — Other Ambulatory Visit: Payer: Medicare Other | Admitting: Lab

## 2012-02-21 ENCOUNTER — Other Ambulatory Visit: Payer: Self-pay | Admitting: *Deleted

## 2012-02-21 DIAGNOSIS — C7A090 Malignant carcinoid tumor of the bronchus and lung: Secondary | ICD-10-CM

## 2012-02-21 DIAGNOSIS — C801 Malignant (primary) neoplasm, unspecified: Secondary | ICD-10-CM

## 2012-02-21 DIAGNOSIS — D3A8 Other benign neuroendocrine tumors: Secondary | ICD-10-CM

## 2012-02-21 LAB — CBC WITH DIFFERENTIAL/PLATELET
BASO%: 0.3 % (ref 0.0–2.0)
EOS%: 2 % (ref 0.0–7.0)
LYMPH%: 32.8 % (ref 14.0–49.7)
MCH: 24.8 pg — ABNORMAL LOW (ref 25.1–34.0)
MCHC: 31.3 g/dL — ABNORMAL LOW (ref 31.5–36.0)
MCV: 79.3 fL — ABNORMAL LOW (ref 79.5–101.0)
MONO%: 8.9 % (ref 0.0–14.0)
Platelets: 354 10*3/uL (ref 145–400)
RBC: 4.55 10*6/uL (ref 3.70–5.45)
nRBC: 0 % (ref 0–0)

## 2012-02-23 ENCOUNTER — Other Ambulatory Visit: Payer: Self-pay | Admitting: *Deleted

## 2012-02-23 DIAGNOSIS — D3A00 Benign carcinoid tumor of unspecified site: Secondary | ICD-10-CM

## 2012-02-24 ENCOUNTER — Telehealth: Payer: Self-pay | Admitting: Oncology

## 2012-02-24 NOTE — Telephone Encounter (Signed)
lmonvm adviisng the pt that she had to be fasting after midnite for her lab appt on 02/29/2012 per md orders

## 2012-02-28 ENCOUNTER — Other Ambulatory Visit: Payer: Self-pay | Admitting: *Deleted

## 2012-02-28 DIAGNOSIS — D3A00 Benign carcinoid tumor of unspecified site: Secondary | ICD-10-CM

## 2012-02-29 ENCOUNTER — Other Ambulatory Visit (HOSPITAL_BASED_OUTPATIENT_CLINIC_OR_DEPARTMENT_OTHER): Payer: Medicare Other | Admitting: Lab

## 2012-02-29 DIAGNOSIS — C7A00 Malignant carcinoid tumor of unspecified site: Secondary | ICD-10-CM

## 2012-02-29 DIAGNOSIS — C7A090 Malignant carcinoid tumor of the bronchus and lung: Secondary | ICD-10-CM

## 2012-02-29 DIAGNOSIS — D3A00 Benign carcinoid tumor of unspecified site: Secondary | ICD-10-CM

## 2012-03-14 ENCOUNTER — Telehealth: Payer: Self-pay | Admitting: *Deleted

## 2012-03-14 NOTE — Telephone Encounter (Signed)
Call from pt reporting she has a cold and has lost her voice. Temp 99.0. Asking if there is anything she can take for this. Informed her that this will likely run its course. OK to take OTC cold remedy as needed. She voiced understanding.

## 2012-03-14 NOTE — Telephone Encounter (Signed)
Pt returned call, she had faxed a request for a letter to be dictated stating her "definitive diagnosis" and that her cancer is in her lungs. Request to Dr. Truett Perna.

## 2012-03-14 NOTE — Telephone Encounter (Signed)
Called pt, please call office re: letter she requested.

## 2012-03-18 ENCOUNTER — Encounter: Payer: Self-pay | Admitting: Oncology

## 2012-04-03 ENCOUNTER — Telehealth: Payer: Self-pay | Admitting: Oncology

## 2012-04-03 ENCOUNTER — Telehealth: Payer: Self-pay | Admitting: *Deleted

## 2012-04-03 ENCOUNTER — Ambulatory Visit (HOSPITAL_BASED_OUTPATIENT_CLINIC_OR_DEPARTMENT_OTHER): Payer: Medicare Other | Admitting: Oncology

## 2012-04-03 VITALS — BP 158/85 | HR 106 | Temp 98.9°F | Resp 18 | Ht 66.0 in | Wt 210.4 lb

## 2012-04-03 DIAGNOSIS — C7B8 Other secondary neuroendocrine tumors: Secondary | ICD-10-CM

## 2012-04-03 DIAGNOSIS — C7A8 Other malignant neuroendocrine tumors: Secondary | ICD-10-CM

## 2012-04-03 DIAGNOSIS — C7A00 Malignant carcinoid tumor of unspecified site: Secondary | ICD-10-CM

## 2012-04-03 NOTE — Progress Notes (Signed)
   [facility]    OFFICE PROGRESS NOTE   INTERVAL HISTORY:   She returns as scheduled. She reports a "cold "for the past 2 weeks with hoarseness and a sore throat. She has chronic exertional dyspnea. She is now being followed by Dr. Clent Ridges for management of hypertension. She was seen in the emergency room on 02/05/2012 with "dizziness ". A CT of the chest revealed stable pulmonary nodules. No evidence of acute pulmonary embolism. She was seen in the emergency room on 01/24/2012 with similar symptoms and a brain CT was negative for metastatic disease. A CT the chest was negative for pulmonary and was him. The chest CT on 01/24/2012 revealed innumerable pulmonary nodules with a few new nodules including a 15 mm lesion in the right lower lobe.  She complains of malaise. She has a good appetite. No fever. Occasional episode of flushing. No diarrhea.  Objective:  Vital signs in last 24 hours:  Blood pressure 158/85, pulse 106, temperature 98.9 F (37.2 C), temperature source Oral, resp. rate 18, height 5\' 6"  (1.676 m), weight 210 lb 6.4 oz (95.437 kg).    HEENT: Neck without mass, pharynx without erythema Lymphatics: No cervical, supraclavicular, axillary, or inguinal nodes Resp: Lungs clear bilaterally, no respiratory distress Cardio: Regular rate and rhythm GI: No hepatosplenomegaly, no mass Vascular: No leg edema   Lab Results:  Lab Results  Component Value Date   WBC 11.0* 02/21/2012   HGB 11.3* 02/21/2012   HCT 36.1 02/21/2012   MCV 79.3* 02/21/2012   PLT 354 02/21/2012   ferritin 15 on 02/29/2012  Pancreatic polypeptide 423 (100-780) on 02/29/2012   Medications: I have reviewed the patient's current medications.  Assessment/Plan: 1. Metastatic neuroendocrine carcinoma involving lung nodules.  a. Restaging CT evaluation at [facility] on 11/23/2010 revealed no change in the bilateral lung nodules compared to a CT from 01/13/2010 with no evidence of progressive metastatic  disease. b. Restaging CT 09/02/2011, compared to the [facility] study from 11/23/2010 revealed a decrease in the size of some of the pulmonary nodules. An upper pole left renal lesion is unchanged, a lower indeterminate left renal lesion was not imaged on the previous study. c. CT the chest 01/24/2012 with overall stable lung nodules and a few new lesions, CT of the chest on 02/05/2012 unchanged d. Normal pancreatic polypeptide 02/29/2012 2. History of a "colon polyp" resected in 2005. 3. History of iron-deficiency anemia. Mild anemia with a low ferritin level in July 2013, she reports undergoing multiple colonoscopy examinations over the past few years for evaluation of iron deficiency 4. Hypertension-managed by Dr. Clent Ridges  Disposition:  She appears asymptomatic from the metastatic neuroendocrine carcinoma. The pancreatic polypeptide was lower in July and a CT the chest showed a few new lung nodules. The plan is to continue an observation approach. She will return for an office and lab visit in 3 months. We will schedule a restaging CT of the chest when she returns in 3 months.   Thornton Papas, MD  04/03/2012  11:24 AM

## 2012-04-03 NOTE — Telephone Encounter (Signed)
Left message on voicemail for pt to call office. (Dr. Truett Perna ordered CT, to follow up lung nodules.)

## 2012-04-03 NOTE — Telephone Encounter (Signed)
lmonvm advising the pt of her lab and ct scan appt in nov

## 2012-04-03 NOTE — Telephone Encounter (Signed)
gve the pt her nov 2013 appt calendar °

## 2012-05-09 ENCOUNTER — Telehealth: Payer: Self-pay | Admitting: Oncology

## 2012-05-09 ENCOUNTER — Telehealth: Payer: Self-pay | Admitting: *Deleted

## 2012-05-09 NOTE — Telephone Encounter (Signed)
Talked to patient , gave her appt date for November 2013 lab and Ct then MD visit on 07/05/12

## 2012-05-09 NOTE — Telephone Encounter (Signed)
Received call from pt "having questions about my schedule"  Returned call and spoke with pt explaining that 07/03/12 Lab at 9:30 then CT scan at 10:30 then 11/21 MD appt.   Pt stated she was confused about lab, but able to voice back correct date/time for appts.

## 2012-06-25 ENCOUNTER — Other Ambulatory Visit: Payer: Self-pay | Admitting: Internal Medicine

## 2012-06-25 DIAGNOSIS — Z1231 Encounter for screening mammogram for malignant neoplasm of breast: Secondary | ICD-10-CM

## 2012-07-03 ENCOUNTER — Other Ambulatory Visit (HOSPITAL_BASED_OUTPATIENT_CLINIC_OR_DEPARTMENT_OTHER): Payer: Medicare Other | Admitting: Lab

## 2012-07-03 ENCOUNTER — Ambulatory Visit (HOSPITAL_COMMUNITY)
Admission: RE | Admit: 2012-07-03 | Discharge: 2012-07-03 | Disposition: A | Discharge status: Home / Self Care | Payer: Medicare Other | Source: Ambulatory Visit | Attending: Oncology | Admitting: Oncology

## 2012-07-03 DIAGNOSIS — C7A00 Malignant carcinoid tumor of unspecified site: Secondary | ICD-10-CM | POA: Insufficient documentation

## 2012-07-03 DIAGNOSIS — K7689 Other specified diseases of liver: Secondary | ICD-10-CM | POA: Insufficient documentation

## 2012-07-03 DIAGNOSIS — C7A8 Other malignant neuroendocrine tumors: Secondary | ICD-10-CM

## 2012-07-03 DIAGNOSIS — K449 Diaphragmatic hernia without obstruction or gangrene: Secondary | ICD-10-CM | POA: Insufficient documentation

## 2012-07-03 DIAGNOSIS — C7B8 Other secondary neuroendocrine tumors: Secondary | ICD-10-CM | POA: Insufficient documentation

## 2012-07-03 LAB — CBC WITH DIFFERENTIAL/PLATELET
Basophils Absolute: 0 10*3/uL (ref 0.0–0.1)
HCT: 33 % — ABNORMAL LOW (ref 34.8–46.6)
HGB: 10.1 g/dL — ABNORMAL LOW (ref 11.6–15.9)
MONO#: 0.5 10*3/uL (ref 0.1–0.9)
NEUT%: 72.2 % (ref 38.4–76.8)
Platelets: 394 10*3/uL (ref 145–400)
WBC: 10.8 10*3/uL — ABNORMAL HIGH (ref 3.9–10.3)
lymph#: 2.3 10*3/uL (ref 0.9–3.3)

## 2012-07-05 ENCOUNTER — Other Ambulatory Visit: Payer: Medicare Other | Admitting: Lab

## 2012-07-05 ENCOUNTER — Ambulatory Visit (HOSPITAL_BASED_OUTPATIENT_CLINIC_OR_DEPARTMENT_OTHER): Payer: Medicare Other | Admitting: Oncology

## 2012-07-05 ENCOUNTER — Telehealth: Payer: Self-pay | Admitting: Oncology

## 2012-07-05 DIAGNOSIS — C7B8 Other secondary neuroendocrine tumors: Secondary | ICD-10-CM

## 2012-07-05 DIAGNOSIS — N289 Disorder of kidney and ureter, unspecified: Secondary | ICD-10-CM

## 2012-07-05 DIAGNOSIS — D649 Anemia, unspecified: Secondary | ICD-10-CM

## 2012-07-05 DIAGNOSIS — C7A00 Malignant carcinoid tumor of unspecified site: Secondary | ICD-10-CM

## 2012-07-05 NOTE — Telephone Encounter (Signed)
Gave pt appt for May 2014 lab and MD °

## 2012-07-05 NOTE — Patient Instructions (Signed)
Start ferrous sulfate 325mg  twice daily

## 2012-07-05 NOTE — Progress Notes (Signed)
   [facility]    OFFICE PROGRESS NOTE   INTERVAL HISTORY:   She returns as scheduled. She complains of malaise and intermittent "dizziness ". No bleeding.  Objective:  Vital signs in last 24 hours:  There were no vitals taken for this visit.    HEENT: Neck without mass Lymphatics: No cervical, supraclavicular, axillary, or inguinal nodes Resp: Lungs clear bilaterally Cardio: Regular rate and rhythm GI: No hepatosplenomegaly, no mass, nontender Vascular: No leg edema Neuro: The face is symmetric, extraocular movements are intact, finger to nose testing is normal    Lab Results:  Lab Results  Component Value Date   WBC 10.8* 07/03/2012   HGB 10.1* 07/03/2012   HCT 33.0* 07/03/2012   MCV 74.0* 07/03/2012   PLT 394 07/03/2012   ANC 7.8 Ferritin on 07/03/2012-8  X-rays-CT the chest on 07/03/2012: numerous bilateral pulmonary nodules are unchanged compared to a CT from 02/05/2012.  Medications: I have reviewed the patient's current medications.  Assessment/Plan: 1. Metastatic neuroendocrine carcinoma involving lung nodules.  a. Restaging CT evaluation at [facility] on 11/23/2010 revealed no change in the bilateral lung nodules compared to a CT from 01/13/2010 with no evidence of progressive metastatic disease. b. Restaging CT 09/02/2011, compared to the [facility] study from 11/23/2010 revealed a decrease in the size of some of the pulmonary nodules. An upper pole left renal lesion is unchanged, a lower indeterminate left renal lesion was not imaged on the previous study. c. CT the chest 01/24/2012 with overall stable lung nodules and a few new lesions, CT of the chest on 02/05/2012 unchanged d. Normal pancreatic polypeptide 02/29/2012 e. CT the chest 07/03/2012-no change in the bilateral lung nodules 2. History of a "colon polyp" resected in 2005. 3. History of iron-deficiency anemia. Mild anemia with a low ferritin level in July 2013, she reports undergoing multiple  colonoscopy examinations over the past few years for evaluation of iron deficiency. She appears to have iron deficiency anemia. She will begin iron therapy. 4. Hypertension-managed by Dr. Clent Ridges 5. "Dizziness "-this is most likely not related to the metastatic neuroendocrine tumor.  Disposition:  Ms. Ashley Farmer appears stable from an oncology standpoint. She will begin iron therapy and return for a CBC in 4-5 weeks. She is scheduled for a 3 month office visit. She reports undergoing an upper endoscopy and colonoscopy for evaluation of anemia approximately 2 years ago. We will ask her to bring [place] a copy of these reports.     Thornton Papas, MD  07/05/2012  10:33 AM

## 2012-07-06 ENCOUNTER — Telehealth: Payer: Self-pay | Admitting: *Deleted

## 2012-07-06 NOTE — Telephone Encounter (Signed)
Patient called to request copy of her last CT scan and labs be sent to her. Forwarded request to HIM department.  Dr. Truett Perna is requesting patient to bring most recent EGD & colonoscopy reports to him, or attempt to locate these in past records.

## 2012-07-10 LAB — PANCREATIC POLYPEPTIDE

## 2012-07-30 ENCOUNTER — Ambulatory Visit
Admission: RE | Admit: 2012-07-30 | Discharge: 2012-07-30 | Disposition: A | Discharge status: Home / Self Care | Payer: Medicare Other | Source: Ambulatory Visit | Attending: Internal Medicine | Admitting: Internal Medicine

## 2012-07-30 DIAGNOSIS — Z1231 Encounter for screening mammogram for malignant neoplasm of breast: Secondary | ICD-10-CM

## 2012-08-01 ENCOUNTER — Ambulatory Visit: Payer: Medicare Other | Admitting: Nurse Practitioner

## 2012-10-05 ENCOUNTER — Other Ambulatory Visit (HOSPITAL_BASED_OUTPATIENT_CLINIC_OR_DEPARTMENT_OTHER): Payer: Medicare Other | Admitting: Lab

## 2012-10-05 ENCOUNTER — Telehealth: Payer: Self-pay | Admitting: Oncology

## 2012-10-05 ENCOUNTER — Ambulatory Visit (HOSPITAL_BASED_OUTPATIENT_CLINIC_OR_DEPARTMENT_OTHER): Payer: Medicare Other | Admitting: Oncology

## 2012-10-05 ENCOUNTER — Other Ambulatory Visit: Payer: Self-pay | Admitting: Oncology

## 2012-10-05 VITALS — BP 142/72 | HR 75 | Temp 98.0°F | Resp 20 | Ht 66.0 in | Wt 206.5 lb

## 2012-10-05 DIAGNOSIS — R911 Solitary pulmonary nodule: Secondary | ICD-10-CM

## 2012-10-05 DIAGNOSIS — D509 Iron deficiency anemia, unspecified: Secondary | ICD-10-CM

## 2012-10-05 DIAGNOSIS — D649 Anemia, unspecified: Secondary | ICD-10-CM

## 2012-10-05 DIAGNOSIS — C7A00 Malignant carcinoid tumor of unspecified site: Secondary | ICD-10-CM

## 2012-10-05 DIAGNOSIS — I10 Essential (primary) hypertension: Secondary | ICD-10-CM

## 2012-10-05 LAB — CBC WITH DIFFERENTIAL/PLATELET
BASO%: 0.3 % (ref 0.0–2.0)
Basophils Absolute: 0 10*3/uL (ref 0.0–0.1)
EOS%: 2.7 % (ref 0.0–7.0)
HCT: 35.5 % (ref 34.8–46.6)
HGB: 10.9 g/dL — ABNORMAL LOW (ref 11.6–15.9)
LYMPH%: 29.5 % (ref 14.0–49.7)
MCH: 23 pg — ABNORMAL LOW (ref 25.1–34.0)
MCHC: 30.7 g/dL — ABNORMAL LOW (ref 31.5–36.0)
MCV: 75.1 fL — ABNORMAL LOW (ref 79.5–101.0)
MONO%: 8.2 % (ref 0.0–14.0)
NEUT%: 59.3 % (ref 38.4–76.8)
Platelets: 378 10*3/uL (ref 145–400)

## 2012-10-05 NOTE — Patient Instructions (Signed)
Increase iron to twice daily

## 2012-10-05 NOTE — Telephone Encounter (Signed)
Gave pt appt for lab and MD on May 2014 °

## 2012-10-05 NOTE — Progress Notes (Signed)
   [facility]    OFFICE PROGRESS NOTE   INTERVAL HISTORY:   She returns as scheduled. She is taking iron once daily. No bleeding. She recently had "pink eye ", and upper respiratory infection, and a GI virus. She continues to have fullness in the right ear. Good appetite and energy level. No dyspnea.  Objective:  Vital signs in last 24 hours:  Blood pressure 142/72, pulse 75, temperature 98 F (36.7 C), temperature source Oral, resp. rate 20, height 5\' 6"  (1.676 m), weight 206 lb 8 oz (93.668 kg).    HEENT: Neck without mass. Mild cerumen in the right external canal, good light reflex Lymphatics: No cervical, supra-clavicular, axillary, or inguinal nodes Resp: Lungs clear bilaterally Cardio: Regular rate and rhythm GI: Nontender, no hepatosplenomegaly Vascular: No leg edema   Lab Results:  Lab Results  Component Value Date   WBC 8.0 10/05/2012   HGB 10.9* 10/05/2012   HCT 35.5 10/05/2012   MCV 75.1* 10/05/2012   PLT 378 10/05/2012   ANC 4.7  Ferritin-8 on 07/03/2012   Medications: I have reviewed the patient's current medications.  Assessment/Plan: 1. Metastatic neuroendocrine carcinoma involving lung nodules.  a. Restaging CT evaluation at [facility] on 11/23/2010 revealed no change in the bilateral lung nodules compared to a CT from 01/13/2010 with no evidence of progressive metastatic disease. b. Restaging CT 09/02/2011, compared to the [facility] study from 11/23/2010 revealed a decrease in the size of some of the pulmonary nodules. An upper pole left renal lesion is unchanged, a lower indeterminate left renal lesion was not imaged on the previous study. c. CT the chest 01/24/2012 with overall stable lung nodules and a few new lesions, CT of the chest on 02/05/2012 unchanged d. Normal pancreatic polypeptide 02/29/2012 e. CT the chest 07/03/2012-no change in the bilateral lung nodules 2. History of a "colon polyp" resected in 2005. 3. History of iron-deficiency  anemia. Mild anemia with a low ferritin level in July 2013 and November 2013, she reports undergoing multiple colonoscopy examinations over the past few years for evaluation of iron deficiency. The anemia has partially improved with iron.  4. Hypertension-managed by Dr. Clent Ridges   Disposition:  She appears stable. I recommended she increase the ferrous sulfate to twice daily. She will return for an office visit and CBC in 3 months. She reports a chronic history of iron deficiency. She will bring [place] a report of upper/lower endoscopy procedures that were performed within the past few years. The pancreatic polypeptide was repeated today.   Thornton Papas, MD  10/05/2012  10:17 AM

## 2012-10-20 LAB — PANCREATIC POLYPEPTIDE

## 2012-11-09 ENCOUNTER — Telehealth: Payer: Self-pay | Admitting: *Deleted

## 2012-11-09 NOTE — Telephone Encounter (Signed)
Left message on voicemail for pt to call office for lab results. (Pancreatic polypeptide results are normal, per Dr. Truett Perna.)

## 2012-11-26 NOTE — Telephone Encounter (Signed)
Pt called regarding message left 11/09/12.  Pt informed of lab results; verbalized understanding and confirmed appt for 01/03/13.

## 2013-01-02 ENCOUNTER — Telehealth: Payer: Self-pay | Admitting: Oncology

## 2013-01-02 NOTE — Telephone Encounter (Signed)
Returned pt's call re cx'ing 5/22 appt. Per pt she is going out of town and will call back to r/s. Message to desk nurse.

## 2013-01-03 ENCOUNTER — Other Ambulatory Visit: Payer: Medicare Other | Admitting: Lab

## 2013-01-03 ENCOUNTER — Ambulatory Visit: Payer: Medicare Other | Admitting: Oncology

## 2013-03-18 ENCOUNTER — Telehealth: Payer: Self-pay | Admitting: *Deleted

## 2013-03-18 NOTE — Telephone Encounter (Signed)
Pt called wanting a lab appt w/ ov. gv appt for 04/30/13 @3 :30pm. However i did not schedule an lab. i emailed GBS informing him that i needed new lab orders...td

## 2013-03-20 ENCOUNTER — Telehealth: Payer: Self-pay | Admitting: *Deleted

## 2013-03-20 NOTE — Telephone Encounter (Signed)
sw pt gv lab appt for 04/30/13 @ 3pm...td

## 2013-04-30 ENCOUNTER — Other Ambulatory Visit (HOSPITAL_BASED_OUTPATIENT_CLINIC_OR_DEPARTMENT_OTHER): Payer: Medicare Other | Admitting: Lab

## 2013-04-30 ENCOUNTER — Ambulatory Visit (HOSPITAL_BASED_OUTPATIENT_CLINIC_OR_DEPARTMENT_OTHER): Payer: Medicare Other | Admitting: Oncology

## 2013-04-30 ENCOUNTER — Telehealth: Payer: Self-pay | Admitting: Oncology

## 2013-04-30 VITALS — BP 156/57 | HR 95 | Temp 98.7°F | Resp 20 | Ht 66.0 in | Wt 211.2 lb

## 2013-04-30 DIAGNOSIS — D649 Anemia, unspecified: Secondary | ICD-10-CM

## 2013-04-30 DIAGNOSIS — C7A1 Malignant poorly differentiated neuroendocrine tumors: Secondary | ICD-10-CM

## 2013-04-30 LAB — CBC WITH DIFFERENTIAL/PLATELET
BASO%: 0.4 % (ref 0.0–2.0)
Basophils Absolute: 0.1 10*3/uL (ref 0.0–0.1)
EOS%: 1.2 % (ref 0.0–7.0)
HGB: 8.8 g/dL — ABNORMAL LOW (ref 11.6–15.9)
MCH: 20.5 pg — ABNORMAL LOW (ref 25.1–34.0)
MCHC: 29.5 g/dL — ABNORMAL LOW (ref 31.5–36.0)
MCV: 69.4 fL — ABNORMAL LOW (ref 79.5–101.0)
MONO%: 7.3 % (ref 0.0–14.0)
RDW: 17.9 % — ABNORMAL HIGH (ref 11.2–14.5)
lymph#: 3.6 10*3/uL — ABNORMAL HIGH (ref 0.9–3.3)

## 2013-04-30 NOTE — Telephone Encounter (Signed)
Gave pt appt for lab and MD on 10/13,Ct same day

## 2013-04-30 NOTE — Progress Notes (Signed)
   [facility]    OFFICE PROGRESS NOTE   INTERVAL HISTORY:   She returns as scheduled. She reports an increased cough productive of a clear sputum. She is now taking iron. No bleeding. She reports malaise. She has not forwarded previous colonoscopy reports to [place]. She reports undergoing an extensive GI evaluations in [place] in [place] including upper/lower endoscopies and a camera endoscopy.  Objective:  Vital signs in last 24 hours:  Blood pressure 156/57, pulse 95, temperature 98.7 F (37.1 C), temperature source Oral, resp. rate 20, height 5\' 6"  (1.676 m), weight 211 lb 3.2 oz (95.8 kg).    HEENT: Neck without mass Lymphatics: No cervical, supra-clavicular, axillary, or inguinal nodes Resp: Lungs clear bilaterally Cardio: Regular rate and rhythm GI: No hepatomegaly, no mass Vascular: No leg edema   Lab Results:  Lab Results  Component Value Date   WBC 13.1* 04/30/2013   HGB 8.8* 04/30/2013   HCT 29.9* 04/30/2013   MCV 69.4* 04/30/2013   PLT 430* 04/30/2013   ANC 8.3   Medications: I have reviewed the patient's current medications.  Assessment/Plan: 1. Metastatic neuroendocrine carcinoma involving lung nodules.  a. Restaging CT evaluation at [facility] on 11/23/2010 revealed no change in the bilateral lung nodules compared to a CT from 01/13/2010 with no evidence of progressive metastatic disease. b. Restaging CT 09/02/2011, compared to the [facility] study from 11/23/2010 revealed a decrease in the size of some of the pulmonary nodules. An upper pole left renal lesion is unchanged, a lower indeterminate left renal lesion was not imaged on the previous study. c. CT the chest 01/24/2012 with overall stable lung nodules and a few new lesions, CT of the chest on 02/05/2012 unchanged d. Normal pancreatic polypeptide 02/29/2012  e. CT the chest 07/03/2012-no change in the bilateral lung nodules f. Normal pancreatic polypeptide 10/05/2012 2. History of a "colon polyp"  resected in 2005. 3. History of iron-deficiency anemia. Mild anemia with a low ferritin level in July 2013 and November 2013, she reports undergoing multiple colonoscopy examinations over the past few years for evaluation of iron deficiency. The anemia partially improved with iron and is now lower. She is no longer taking iron. 4. Hypertension-managed by Dr. Clent Ridges  Disposition:  The anemia has progressed. I recommended she begin ferrous sulfate 3 times daily. She will return for an office visit, CBC, and restaging CT of the chest in one month. She will contact us in the interim for new symptoms. Ms. Glore will fax [place] the reports from her previous GI evaluation.? 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   Thornton Papas, MD  04/30/2013  4:01 PM

## 2013-05-24 ENCOUNTER — Other Ambulatory Visit: Payer: Self-pay | Admitting: *Deleted

## 2013-05-24 DIAGNOSIS — C7A00 Malignant carcinoid tumor of unspecified site: Secondary | ICD-10-CM

## 2013-05-27 ENCOUNTER — Ambulatory Visit (HOSPITAL_BASED_OUTPATIENT_CLINIC_OR_DEPARTMENT_OTHER): Payer: Medicare Other | Admitting: Oncology

## 2013-05-27 ENCOUNTER — Ambulatory Visit (HOSPITAL_COMMUNITY)
Admission: RE | Admit: 2013-05-27 | Discharge: 2013-05-27 | Disposition: A | Discharge status: Home / Self Care | Payer: Medicare Other | Source: Ambulatory Visit | Attending: Oncology | Admitting: Oncology

## 2013-05-27 ENCOUNTER — Other Ambulatory Visit: Payer: Medicare Other | Admitting: Lab

## 2013-05-27 ENCOUNTER — Other Ambulatory Visit (HOSPITAL_BASED_OUTPATIENT_CLINIC_OR_DEPARTMENT_OTHER): Payer: Medicare Other | Admitting: Lab

## 2013-05-27 VITALS — BP 157/75 | HR 79 | Temp 97.3°F | Resp 20 | Ht 66.0 in | Wt 208.7 lb

## 2013-05-27 DIAGNOSIS — C7A00 Malignant carcinoid tumor of unspecified site: Secondary | ICD-10-CM

## 2013-05-27 DIAGNOSIS — N289 Disorder of kidney and ureter, unspecified: Secondary | ICD-10-CM | POA: Insufficient documentation

## 2013-05-27 DIAGNOSIS — D3500 Benign neoplasm of unspecified adrenal gland: Secondary | ICD-10-CM | POA: Insufficient documentation

## 2013-05-27 DIAGNOSIS — C7A090 Malignant carcinoid tumor of the bronchus and lung: Secondary | ICD-10-CM

## 2013-05-27 DIAGNOSIS — R918 Other nonspecific abnormal finding of lung field: Secondary | ICD-10-CM | POA: Insufficient documentation

## 2013-05-27 DIAGNOSIS — D509 Iron deficiency anemia, unspecified: Secondary | ICD-10-CM

## 2013-05-27 DIAGNOSIS — C7A1 Malignant poorly differentiated neuroendocrine tumors: Secondary | ICD-10-CM | POA: Insufficient documentation

## 2013-05-27 DIAGNOSIS — R05 Cough: Secondary | ICD-10-CM

## 2013-05-27 DIAGNOSIS — D649 Anemia, unspecified: Secondary | ICD-10-CM

## 2013-05-27 DIAGNOSIS — K449 Diaphragmatic hernia without obstruction or gangrene: Secondary | ICD-10-CM | POA: Insufficient documentation

## 2013-05-27 LAB — COMPREHENSIVE METABOLIC PANEL (CC13)
ALT: 7 U/L (ref 0–55)
AST: 8 U/L (ref 5–34)
Albumin: 3.1 g/dL — ABNORMAL LOW (ref 3.5–5.0)
Alkaline Phosphatase: 80 U/L (ref 40–150)
Calcium: 9.1 mg/dL (ref 8.4–10.4)
Chloride: 105 mEq/L (ref 98–109)
Potassium: 4 mEq/L (ref 3.5–5.1)
Sodium: 139 mEq/L (ref 136–145)
Total Protein: 7.8 g/dL (ref 6.4–8.3)

## 2013-05-27 LAB — CBC WITH DIFFERENTIAL/PLATELET
EOS%: 1.7 % (ref 0.0–7.0)
HGB: 9.5 g/dL — ABNORMAL LOW (ref 11.6–15.9)
MCH: 20.8 pg — ABNORMAL LOW (ref 25.1–34.0)
MCV: 70.2 fL — ABNORMAL LOW (ref 79.5–101.0)
MONO%: 5.3 % (ref 0.0–14.0)
NEUT#: 8.9 10*3/uL — ABNORMAL HIGH (ref 1.5–6.5)
RBC: 4.55 10*6/uL (ref 3.70–5.45)
RDW: 19.1 % — ABNORMAL HIGH (ref 11.2–14.5)
lymph#: 3 10*3/uL (ref 0.9–3.3)

## 2013-05-27 NOTE — Progress Notes (Signed)
   [facility]    OFFICE PROGRESS NOTE   INTERVAL HISTORY:   Ashley Farmer returns for scheduled followup of the metastatic neuroendocrine carcinoma. She recently had a "cold ". She is noted increased cough after this episode. No dyspnea. She has discomfort at the right costal margin. The pain is nonpleuritic. No leg swelling or pain. She is taking iron once daily. She notes a decrease in stool frequency when taking iron. She reports a chronic history of iron deficiency anemia.  Objective:  Vital signs in last 24 hours:  Blood pressure 157/75, pulse 79, temperature 97.3 F (36.3 C), temperature source Oral, resp. rate 20, height 5\' 6"  (1.676 m), weight 208 lb 11.2 oz (94.666 kg).    HEENT:  Neck without mass Lymphatics: No cervical, supraclavicular, or axillary nodes Resp: Lungs clear bilaterally Cardio: Regular rate and rhythm GI: No hepatomegaly, no mass, mild tenderness at the right costal margin Vascular: No leg edema   Lab Results:  Lab Results  Component Value Date   WBC 12.8* 05/27/2013   HGB 9.5* 05/27/2013   HCT 31.9* 05/27/2013   MCV 70.2* 05/27/2013   PLT 444* 05/27/2013   ANC 8.9  X-rays:  CT of the chest on 05/27/2013, compared to 07/03/2012-multiple bilateral lung nodule are unchanged. No pleural fluid.   Medications: I have reviewed the patient's current medications.  Assessment/Plan: 1. Metastatic neuroendocrine carcinoma involving lung nodules.  a. Restaging CT evaluation at [facility] on 11/23/2010 revealed no change in the bilateral lung nodules compared to a CT from 01/13/2010 with no evidence of progressive metastatic disease. b. Restaging CT 09/02/2011, compared to the [facility] study from 11/23/2010 revealed a decrease in the size of some of the pulmonary nodules. An upper pole left renal lesion is unchanged, a lower indeterminate left renal lesion was not imaged on the previous study. c. CT the chest 01/24/2012 with overall stable lung nodules  and a few new lesions, CT of the chest on 02/05/2012 unchanged d. Normal pancreatic polypeptide 02/29/2012  e. CT the chest 07/03/2012-no change in the bilateral lung nodules f. Normal pancreatic polypeptide 10/05/2012 g. CT of the chest 05/27/2013-no change in the bilateral lung nodule 2. History of a "colon polyp" resected in 2005. 3. History of iron-deficiency anemia. Mild anemia with a low ferritin level in July 2013 and November 2013, she reports undergoing multiple colonoscopy examinations over the past few years for evaluation of iron deficiency. The anemia has partially improved since resuming iron in September of 2014. She underwent an upper endoscopy and colonoscopy in September of 2011 for evaluation of iron deficiency anemia. No significant findings in the colon. A single small semi-sessile polyp was found in the stomach without stigmata of bleeding. 4. Hypertension-managed by Dr. Clent Ridges    Disposition:  The restaging CT shows no evidence of progressive tumor in the chest. The chronic cough may be related to reflux symptoms. Ashley Farmer will increase the iron to twice daily. She will return for an office visit and CBC in 3 months. We will check a pancreatic polypeptide when she returns in 3 months.   Thornton Papas, MD  05/27/2013  5:15 PM

## 2013-05-28 ENCOUNTER — Telehealth: Payer: Self-pay | Admitting: Oncology

## 2013-05-28 NOTE — Telephone Encounter (Signed)
S/w the pt and she is aware of her feb 2015 appts °

## 2013-06-20 ENCOUNTER — Other Ambulatory Visit: Payer: Self-pay

## 2013-07-25 ENCOUNTER — Emergency Department (HOSPITAL_COMMUNITY): Payer: Medicare Other

## 2013-07-25 ENCOUNTER — Emergency Department (HOSPITAL_COMMUNITY)
Admission: EM | Admit: 2013-07-25 | Discharge: 2013-07-25 | Disposition: A | Discharge status: Home / Self Care | Payer: Medicare Other | Attending: Emergency Medicine | Admitting: Emergency Medicine

## 2013-07-25 DIAGNOSIS — D509 Iron deficiency anemia, unspecified: Secondary | ICD-10-CM | POA: Insufficient documentation

## 2013-07-25 DIAGNOSIS — Z85118 Personal history of other malignant neoplasm of bronchus and lung: Secondary | ICD-10-CM | POA: Insufficient documentation

## 2013-07-25 DIAGNOSIS — I10 Essential (primary) hypertension: Secondary | ICD-10-CM

## 2013-07-25 DIAGNOSIS — H539 Unspecified visual disturbance: Secondary | ICD-10-CM

## 2013-07-25 DIAGNOSIS — H538 Other visual disturbances: Secondary | ICD-10-CM | POA: Insufficient documentation

## 2013-07-25 DIAGNOSIS — Z79899 Other long term (current) drug therapy: Secondary | ICD-10-CM | POA: Insufficient documentation

## 2013-07-25 DIAGNOSIS — K219 Gastro-esophageal reflux disease without esophagitis: Secondary | ICD-10-CM | POA: Insufficient documentation

## 2013-07-25 LAB — BASIC METABOLIC PANEL
CO2: 25 mEq/L (ref 19–32)
Calcium: 9.5 mg/dL (ref 8.4–10.5)
Creatinine, Ser: 0.96 mg/dL (ref 0.50–1.10)
GFR calc non Af Amer: 62 mL/min — ABNORMAL LOW (ref >90)

## 2013-07-25 LAB — CBC WITH DIFFERENTIAL/PLATELET
Basophils Absolute: 0 10*3/uL (ref 0.0–0.1)
Eosinophils Absolute: 0.1 10*3/uL (ref 0.0–0.7)
Eosinophils Relative: 1 % (ref 0–5)
Lymphs Abs: 1.4 10*3/uL (ref 0.7–4.0)
MCH: 22.4 pg — ABNORMAL LOW (ref 26.0–34.0)
MCHC: 30.3 g/dL (ref 30.0–36.0)
MCV: 73.9 fL — ABNORMAL LOW (ref 78.0–100.0)
Monocytes Absolute: 0.6 10*3/uL (ref 0.1–1.0)
Platelets: ADEQUATE 10*3/uL (ref 150–400)
RDW: 17.6 % — ABNORMAL HIGH (ref 11.5–15.5)
Smear Review: ADEQUATE

## 2013-07-25 NOTE — ED Notes (Signed)
MRI called and stated patient is refusing to have MRI done. She states she does not want any meds and can not lay flat. Updated ER resident Thuet.

## 2013-07-25 NOTE — ED Notes (Signed)
Dr. Piedad Climes in to speak with pt/family about d/c plan.

## 2013-07-25 NOTE — ED Notes (Signed)
Patient arrived via [facility] from home. Patient was watching TV when she began to have blurred vision in right eye with spectrum colors and nausea. VSS, A/O. CBG 104.

## 2013-07-25 NOTE — ED Provider Notes (Signed)
CSN: [id number]     Arrival date & time 07/25/13  1441 History   None    Chief Complaint  Patient presents with  . Dizziness   (Consider location/radiation/quality/duration/timing/severity/associated sxs/prior Treatment) HPI Ashley Farmer is a 62 y.o. female who presents to the ED after a transient vision change episode.  Patient reports that 2 hours prior to arrival she was watching TV and noted that the R side of her TV looked funny.  She thought that just the R side of her R eye was seeing it this way.  She tried to fix it by adjusting the lights, but no improvement was noted.  Then, over the subsequent 15 minutes the blurriness and changes of colors spread to both eyes.  This whole episode lasted approximately 45 minutes before going away.  During this time she took her blood pressure and found that it was greater than 200 systolic.  She called her nephew who came to the house and together they called EMS.  By the time EMS arrived, symptoms had resolved and BP had improved to 160.   Patient has history of vertigo and reports that she did have some today but this is the same that she always gets and was actually milder than normal.  Otherwise, no headaches, no vision changes, no unilateral weakness/numbness.  No problems with balance.  No problems speaking or understanding.  No seizure activity.  No other symptoms.  Past Medical History  Diagnosis Date  . lung ca     lung ca  . Iron deficiency anemia   . Hypertension   . GERD (gastroesophageal reflux disease)   . Hiatal hernia   . Positional vertigo    Past Surgical History  Procedure Laterality Date  . Abdominal hysterectomy  1995  . Cholecystectomy  2005  . Appendectomy  2005  . Partial colectomy  2005   No family history on file. History  Substance Use Topics  . Smoking status: Not on file  . Smokeless tobacco: Not on file  . Alcohol Use: Not on file   OB History   Grav Para Term Preterm Abortions TAB SAB Ect Mult Living                  Review of Systems  Constitutional: Negative for fever and chills.  HENT: Negative for congestion and rhinorrhea.   Respiratory: Negative for cough and shortness of breath.   Cardiovascular: Negative for chest pain.  Gastrointestinal: Negative for nausea, vomiting, abdominal pain, diarrhea and abdominal distention.  Endocrine: Negative for polyuria.  Genitourinary: Negative for dysuria.  Musculoskeletal: Negative for neck pain and neck stiffness.  Skin: Negative for rash.  Neurological: Negative for dizziness, tremors, seizures, syncope, facial asymmetry, speech difficulty, weakness, light-headedness, numbness and headaches.  Psychiatric/Behavioral: Negative.     Allergies  Pineapple  Home Medications   Current Outpatient Rx  Name  Route  Sig  Dispense  Refill  . BYSTOLIC 10 MG tablet   Oral   Take 10 mg by mouth daily.         Marland Kitchen estrogens, conjugated, (PREMARIN) 0.3 MG tablet   Oral   Take 0.3 mg by mouth daily. Take daily for 21 days then do not take for 7 days.         . ferrous sulfate 325 (65 FE) MG tablet   Oral   Take 325 mg by mouth daily with breakfast.         . LORazepam (ATIVAN) 1 MG tablet  Oral   Take 1 mg by mouth daily as needed for anxiety.          . meclizine (ANTIVERT) 25 MG tablet   Oral   Take 25 mg by mouth 3 (three) times daily as needed. For dizziness         . omeprazole (PRILOSEC) 20 MG capsule   Oral   Take 20 mg by mouth daily.          BP 154/65  Pulse 72  Temp(Src) 98.3 F (36.8 C) (Oral)  Resp 16  Ht 5\' 6"  (1.676 m)  Wt 200 lb (90.719 kg)  BMI 32.30 kg/m2  SpO2 100% Physical Exam  Nursing note and vitals reviewed. Constitutional: She is oriented to person, place, and time. She appears well-developed and well-nourished. No distress.  HENT:  Head: Normocephalic and atraumatic.  Right Ear: External ear normal.  Left Ear: External ear normal.  Nose: Nose normal.  Mouth/Throat: Oropharynx is  clear and moist. No oropharyngeal exudate.  Eyes: EOM are normal. Pupils are equal, round, and reactive to light.  Neck: Normal range of motion. Neck supple. No tracheal deviation present.  Cardiovascular: Normal rate.   Pulmonary/Chest: Effort normal and breath sounds normal. No stridor. No respiratory distress. She has no wheezes. She has no rales.  Abdominal: Soft. She exhibits no distension. There is no tenderness. There is no rebound.  Musculoskeletal: Normal range of motion.  Neurological: She is alert and oriented to person, place, and time. She has normal strength. No cranial nerve deficit or sensory deficit. She displays a negative Romberg sign. Coordination and gait normal. GCS eye subscore is 4. GCS verbal subscore is 5. GCS motor subscore is 6.  Skin: Skin is warm and dry. She is not diaphoretic.    ED Course  Procedures (including critical care time) Labs Review Labs Reviewed - No data to display Imaging Review No results found.  EKG Interpretation   None       MDM   1. Vision changes   2. Hypertension     Ashley Farmer is a 62 y.o. female with history of metastatic pulmonary neuroendocrine tumor who presented to the ED with vision changes and HTN up to 200 systolic PTA.  On arrival, patient asymptomatic with improved blood pressure.  No abnormalities on neuro exam.  CT head negative.  Initial plan to do MRI for further look at posterior fossa but patient adamantly refused.  Offered to make patient comfortable with medication, but she still refused.  Patient felt reassured that all other tests were negative and that she still felt well.  She requested discharge.  Patient in right state of mind.  She understands that she is always welcome back should she wish for repeat evaluation, further testing, and MRI.  All questions answered.  Patient discharged.    Ashley Koh, MD 07/25/13 [phone]

## 2013-07-26 NOTE — ED Provider Notes (Signed)
Medical screening examination/treatment/procedure(s) were conducted as a shared visit with resident-physician practitioner(s) and myself.  I personally evaluated the patient during the encounter.  Pt is a 62 y.o. female with pmhx as above presenting with transient blurry vision and now improved HTN.  Pt found to have no acute neuro findings on my exam, and has no complaints.  CT head unremarkable.  MRI brain ordered given hx of metastatic lung cancer, but she refuses.  I feel she is safe for d/c, can f/u with PCP, return to ED if symptoms return.   EKG Interpretation    Date/Time:  Thursday July 25 2013 14:55:47 EST Ventricular Rate:  70 PR Interval:  176 QRS Duration: 71 QT Interval:  403 QTC Calculation: 435 R Axis:   33 Text Interpretation:  Sinus rhythm EKG WITHIN NORMAL LIMITS Confirmed by Micheline Maze  MD, MEGAN [phone]) on 07/26/2013 1:54:35 PM              Shanna Cisco, MD 07/26/13 1356

## 2013-09-03 ENCOUNTER — Other Ambulatory Visit: Payer: Medicare Other | Admitting: Lab

## 2013-09-03 ENCOUNTER — Ambulatory Visit: Payer: Medicare Other | Admitting: Oncology

## 2013-10-08 ENCOUNTER — Ambulatory Visit: Payer: Medicare Other | Admitting: Oncology

## 2013-10-08 ENCOUNTER — Other Ambulatory Visit: Payer: Medicare Other

## 2013-10-21 ENCOUNTER — Other Ambulatory Visit: Payer: Self-pay | Admitting: Internal Medicine

## 2013-10-21 DIAGNOSIS — E010 Iodine-deficiency related diffuse (endemic) goiter: Secondary | ICD-10-CM

## 2013-10-29 ENCOUNTER — Ambulatory Visit
Admission: RE | Admit: 2013-10-29 | Discharge: 2013-10-29 | Disposition: A | Discharge status: Home / Self Care | Payer: Medicare Other | Source: Ambulatory Visit | Attending: Internal Medicine | Admitting: Internal Medicine

## 2013-10-29 DIAGNOSIS — E010 Iodine-deficiency related diffuse (endemic) goiter: Secondary | ICD-10-CM

## 2013-11-04 ENCOUNTER — Telehealth: Payer: Self-pay | Admitting: Oncology

## 2013-11-04 NOTE — Telephone Encounter (Signed)
pt called to r/s missed appt...done....pt aware of new d.t °

## 2013-11-08 ENCOUNTER — Telehealth: Payer: Self-pay | Admitting: Oncology

## 2013-11-08 NOTE — Telephone Encounter (Signed)
pt called to r/s appt..done...pt aware of new d.t °

## 2013-11-15 ENCOUNTER — Ambulatory Visit: Payer: Medicare Other | Admitting: Oncology

## 2013-11-15 ENCOUNTER — Other Ambulatory Visit: Payer: Medicare Other

## 2013-12-09 ENCOUNTER — Other Ambulatory Visit (HOSPITAL_BASED_OUTPATIENT_CLINIC_OR_DEPARTMENT_OTHER): Payer: Medicare Other

## 2013-12-09 ENCOUNTER — Telehealth: Payer: Self-pay | Admitting: Oncology

## 2013-12-09 ENCOUNTER — Ambulatory Visit (HOSPITAL_BASED_OUTPATIENT_CLINIC_OR_DEPARTMENT_OTHER): Payer: Medicare Other | Admitting: Oncology

## 2013-12-09 VITALS — BP 170/79 | HR 71 | Temp 97.8°F | Resp 18 | Ht 66.0 in | Wt 204.5 lb

## 2013-12-09 DIAGNOSIS — C7B8 Other secondary neuroendocrine tumors: Secondary | ICD-10-CM

## 2013-12-09 DIAGNOSIS — D509 Iron deficiency anemia, unspecified: Secondary | ICD-10-CM

## 2013-12-09 DIAGNOSIS — D649 Anemia, unspecified: Secondary | ICD-10-CM

## 2013-12-09 DIAGNOSIS — C7A8 Other malignant neuroendocrine tumors: Secondary | ICD-10-CM

## 2013-12-09 DIAGNOSIS — C7A00 Malignant carcinoid tumor of unspecified site: Secondary | ICD-10-CM

## 2013-12-09 DIAGNOSIS — C7A1 Malignant poorly differentiated neuroendocrine tumors: Secondary | ICD-10-CM

## 2013-12-09 DIAGNOSIS — I10 Essential (primary) hypertension: Secondary | ICD-10-CM

## 2013-12-09 LAB — CBC WITH DIFFERENTIAL/PLATELET
BASO%: 0.3 % (ref 0.0–2.0)
BASOS ABS: 0 10*3/uL (ref 0.0–0.1)
EOS ABS: 0.2 10*3/uL (ref 0.0–0.5)
EOS%: 1.4 % (ref 0.0–7.0)
HEMATOCRIT: 35.6 % (ref 34.8–46.6)
HEMOGLOBIN: 10.6 g/dL — AB (ref 11.6–15.9)
LYMPH#: 3.4 10*3/uL — AB (ref 0.9–3.3)
LYMPH%: 29.8 % (ref 14.0–49.7)
MCH: 22.6 pg — AB (ref 25.1–34.0)
MCHC: 29.8 g/dL — ABNORMAL LOW (ref 31.5–36.0)
MCV: 75.7 fL — AB (ref 79.5–101.0)
MONO#: 0.6 10*3/uL (ref 0.1–0.9)
MONO%: 4.9 % (ref 0.0–14.0)
NEUT%: 63.6 % (ref 38.4–76.8)
NEUTROS ABS: 7.3 10*3/uL — AB (ref 1.5–6.5)
Platelets: INCREASED 10*3/uL (ref 145–400)
RBC: 4.7 10*6/uL (ref 3.70–5.45)
RDW: 16.4 % — ABNORMAL HIGH (ref 11.2–14.5)
WBC: 11.5 10*3/uL — ABNORMAL HIGH (ref 3.9–10.3)
nRBC: 0 % (ref 0–0)

## 2013-12-09 NOTE — Telephone Encounter (Signed)
Gave pt appt for lab and MD for July 2015

## 2013-12-09 NOTE — Progress Notes (Signed)
  [place] OFFICE PROGRESS NOTE   Diagnosis: Metastatic neuroendocrine carcinoma  INTERVAL HISTORY:   She returns as scheduled. She complains of an increased cough for the past 3 weeks. She has "spells "of coughing. No fever. Chronic exertional dyspnea. She also reports discomfort in the right "hip ".  Ashley Farmer is taking iron once daily.  Objective:  Vital signs in last 24 hours:  Blood pressure 170/79, pulse 71, temperature 97.8 F (36.6 C), temperature source Oral, resp. rate 18, height 5\' 6"  (1.676 m), weight 204 lb 8 oz (92.761 kg), SpO2 100.00%.    HEENT: Neck without mass Lymphatics: No cervical, supra-clavicular, axillary, or inguinal nodes Resp: Lungs clear bilaterally, no respiratory distress Cardio: Regular rate and rhythm GI: No hepatosplenomegaly, nontender Vascular: No leg edema  Musculoskeletal: No pain with motion at the right hip. Mild tenderness at the right trochanter and in the right lower iliac area. No discrete mass. Slight fullness of the right gluteal musculature compared to the left side     Lab Results:  Lab Results  Component Value Date   WBC 11.5* 12/09/2013   HGB 10.6* 12/09/2013   HCT 35.6 12/09/2013   MCV 75.7* 12/09/2013   PLT Clumped Platelets--Appears Increased 12/09/2013   NEUTROABS 7.3* 12/09/2013     Medications: I have reviewed the patient's current medications.  Assessment/Plan: 1. Metastatic neuroendocrine carcinoma involving lung nodules.  a. Restaging CT evaluation at [facility] on 11/23/2010 revealed no change in the bilateral lung nodules compared to a CT from 01/13/2010 with no evidence of progressive metastatic disease. b. Restaging CT 09/02/2011, compared to the [facility] study from 11/23/2010 revealed a decrease in the size of some of the pulmonary nodules. An upper pole left renal lesion is unchanged, a lower indeterminate left renal lesion was not imaged on the previous study. c. CT the chest 01/24/2012 with overall  stable lung nodules and a few new lesions, CT of the chest on 02/05/2012 unchanged d. Normal pancreatic polypeptide 02/29/2012  e. CT the chest 07/03/2012-no change in the bilateral lung nodules f. Normal pancreatic polypeptide 10/05/2012 g. CT of the chest 05/27/2013-no change in the bilateral lung nodule 2. History of a "colon polyp" resected in 2005. 3. History of iron-deficiency anemia. Mild anemia with a low ferritin level in July 2013 and November 2013, she reports undergoing multiple colonoscopy examinations over the past few years for evaluation of iron deficiency. The anemia partially improved since resuming iron in September of 2014. She underwent an upper endoscopy and colonoscopy in September of 2011 for evaluation of iron deficiency anemia. No significant findings in the colon. A single small semi-sessile polyp was found in the stomach without stigmata of bleeding. 4. Hypertension-managed by Dr. Volanda Napoleon  Disposition:  She reports an increased cough for the past several weeks. I doubt her symptoms are related to progression of the neuroendocrine carcinoma. She will contact me for increased dyspnea or a persistent cough and we will arrange for repeat imaging of the chest.  I recommended she followup with her primary physician to evaluate the "hip "discomfort.  She will continue once daily iron. She reports GI upset with an increased dose of iron.  [name] will return for an office visit and CBC in 3 months. We will followup on the pancreatic polypeptide from today.  Ladell Pier, MD  12/09/2013  10:41 AM

## 2013-12-21 LAB — PANCREATIC POLYPEPTIDE

## 2014-02-13 ENCOUNTER — Ambulatory Visit (HOSPITAL_BASED_OUTPATIENT_CLINIC_OR_DEPARTMENT_OTHER): Payer: Medicare Other | Admitting: Oncology

## 2014-02-13 ENCOUNTER — Telehealth: Payer: Self-pay | Admitting: *Deleted

## 2014-02-13 ENCOUNTER — Ambulatory Visit (HOSPITAL_COMMUNITY)
Admission: RE | Admit: 2014-02-13 | Discharge: 2014-02-13 | Disposition: A | Discharge status: Home / Self Care | Payer: Medicare Other | Source: Ambulatory Visit | Attending: Oncology | Admitting: Oncology

## 2014-02-13 VITALS — BP 170/64 | HR 93 | Temp 98.9°F | Resp 20 | Ht 66.0 in | Wt 201.1 lb

## 2014-02-13 DIAGNOSIS — C7A8 Other malignant neuroendocrine tumors: Secondary | ICD-10-CM

## 2014-02-13 DIAGNOSIS — R911 Solitary pulmonary nodule: Secondary | ICD-10-CM | POA: Diagnosis not present

## 2014-02-13 DIAGNOSIS — R05 Cough: Secondary | ICD-10-CM | POA: Diagnosis present

## 2014-02-13 DIAGNOSIS — R059 Cough, unspecified: Secondary | ICD-10-CM | POA: Diagnosis present

## 2014-02-13 DIAGNOSIS — C7B8 Other secondary neuroendocrine tumors: Secondary | ICD-10-CM

## 2014-02-13 DIAGNOSIS — C7A00 Malignant carcinoid tumor of unspecified site: Secondary | ICD-10-CM

## 2014-02-13 NOTE — Progress Notes (Signed)
[place] OFFICE PROGRESS NOTE   Diagnosis: Metastatic neuroendocrine carcinoma  INTERVAL HISTORY:   She returns prior to a scheduled visit. She has been coughing since she was here in late April. The cough increased last night. No fever, dyspnea, or chest pain. She has pain at the right upper back/posterior shoulder today. No hemoptysis. She coughs up a clear sputum. She complains of rhinorrhea and sinus drainage.  Objective:  Vital signs in last 24 hours:  Blood pressure 170/64, pulse 93, temperature 98.9 F (37.2 C), temperature source Oral, resp. rate 20, height 5\' 6"  (1.676 m), weight 201 lb 1.6 oz (91.218 kg).    HEENT: No sinus tenderness. Pharynx without erythema or exudate, neck without mass Lymphatics: No cervical, supraclavicular, or axillary nodes Resp: Clear bilaterally in the anterior and posterior lung fields Cardio: Regular rate and rhythm GI: No hepatomegaly, nontender Vascular: No leg edema Musculoskeletal: Mild tenderness at the right upper back near the shoulder joint and upper scapula. No mass. Full range of motion at the right shoulder without pain.  Imaging:  Dg Chest 2 View  02/13/2014   CLINICAL DATA:  Progressive cough followup pulmonary nodules. History of lung cancer.  EXAM: CHEST  2 VIEW  COMPARISON:  Chest CT May 27, 2013, chest x-ray January 24, 2012  FINDINGS: The heart size and mediastinal contours are stable. The aorta is tortuous. There is a 1.5 cm nodule in the left lung base unchanged compared to prior CT. There are several less than 1 cm nodules throughout the right lung. It is difficult to discern the change between the chest x-ray an the CT scan from last year as multiple pulmonary nodules are identified on the prior CT scan. The visualized skeletal structures are stable.  IMPRESSION: No acute abnormality. Pulmonary nodules in both lungs, largest measures 1.5 cm in the left lung base unchanged compared to prior CT of October 2014.    Electronically Signed   By: Abelardo Diesel M.D.   On: 02/13/2014 14:37    Medications: I have reviewed the patient's current medications.  Assessment/Plan: 1. Metastatic neuroendocrine carcinoma involving lung nodules.  a. Restaging CT evaluation at [facility] on 11/23/2010 revealed no change in the bilateral lung nodules compared to a CT from 01/13/2010 with no evidence of progressive metastatic disease. b. Restaging CT 09/02/2011, compared to the [facility] study from 11/23/2010 revealed a decrease in the size of some of the pulmonary nodules. An upper pole left renal lesion is unchanged, a lower indeterminate left renal lesion was not imaged on the previous study. c. CT the chest 01/24/2012 with overall stable lung nodules and a few new lesions, CT of the chest on 02/05/2012 unchanged d. Normal pancreatic polypeptide 02/29/2012  e. CT the chest 07/03/2012-no change in the bilateral lung nodules f. Normal pancreatic polypeptide 10/05/2012 g. CT of the chest 05/27/2013-no change in the bilateral lung nodule 2. History of a "colon polyp" resected in 2005. 3. History of iron-deficiency anemia. Mild anemia with a low ferritin level in July 2013 and November 2013, she reports undergoing multiple colonoscopy examinations over the past few years for evaluation of iron deficiency. The anemia partially improved since resuming iron in September of 2014. She underwent an upper endoscopy and colonoscopy in September of 2011 for evaluation of iron deficiency anemia. No significant findings in the colon. A single small semi-sessile polyp was found in the stomach without stigmata of bleeding. 4. Hypertension-managed by Dr. Volanda Napoleon 5. Cough-I doubt the cough is related to the  metastatic neuroendocrine carcinoma. She does not appear to have pneumonia. I Have a low clinical suspicion for pulmonary embolism. The cough may  be related to reflux or sinus drainage.   Disposition:  Ms. Meller has a persistent cough. The cough is  most likely related to sinus drainage or reflux. It is possible the cough is related to the metastatic tumor burden, but a chest x-ray does not reveal a significant change in the lung nodules. We will plan for a restaging CT of the chest if the cough remains significant when she returns in 3 weeks. In the interim she will followup with her primary physician. She plans to try Claritin.  Betsy Coder, MD  02/13/2014  3:37 PM

## 2014-02-13 NOTE — Telephone Encounter (Signed)
Notified patient to leave now and have CXR at [facility] then come over to see Dr. Benay Spice. She is waiting for her transportation to arrive to pick her up.

## 2014-02-13 NOTE — Telephone Encounter (Signed)
   Provider input needed: See today   Reason for call: Cough,N/V,Diarrhea,Pain  Respiratory: positive for cough--progressive since April and much worse this week, since 0200-can't stop coughing. No increase in dyspnea  GI: positive for nausea almost daily basis and vomiting-reports she has not been able to take po's since last night due to cough and N/V; diarrhea-loose stools 5-6 since 0200 Pain: right shoulder blade/upper back that is sharp and started yesterday  ALLERGIES:  is allergic to pineapple.  Patient last received chemotherapy/ treatment on n/a  Patient was last seen in the office on 12/09/13   Next appt is 03/06/14   Is patient having fevers greater than 100.5?  no, temp is normal.   Is patient having uncontrolled pain, or new pain? yes, right shoulder blade/upper back  Is patient having new back pain that changes with position (worsens or eases when laying down?)  no   Is patient able to eat and drink? no, due to cough,gagging,vomits    Is patient able to pass stool without difficulty?   yes, diarrhea 5-6/day loose     Is patient having uncontrolled nausea?  yes    patient calls 02/13/2014 with complaint of  see above   Summary Based on the above information advised patient to wait for RN to talk with MD and call back.   Tania Ade  02/13/2014, 1:06 PM   Background Info  Ashley Farmer   DOB: 24-Jul-1951   MR#: [id number]   CSN#   [id number] 02/13/2014

## 2014-02-23 IMAGING — CT CT CHEST W/O CM
2 of 4 series · 15 of 36 positions shown, 18 images · non-contrast
Comparison: [DATE].

CLINICAL DATA: FOLLOW UP lung cancer.

EXAM:
CT CHEST WITHOUT CONTRAST
TECHNIQUE: Multidetector CT imaging of the chest was performed following the
standard protocol without IV contrast.

[Series 2: chest w/o st · axial · non-contrast · 0.74mm/px · z∈[-246,-6]mm · 12 of 56 slices shown, 15 images]
[im 4/56  mediastinal]
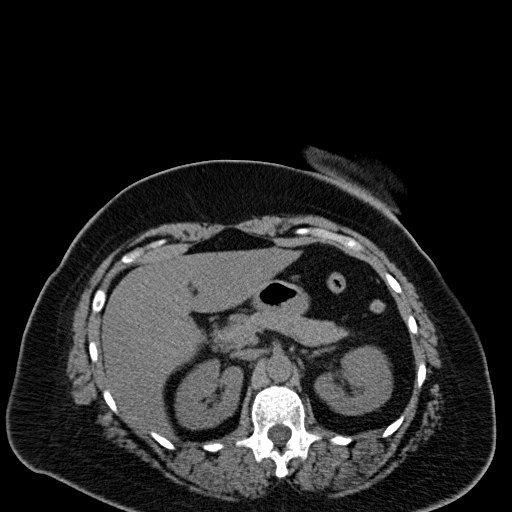
[im 4/56  lung]
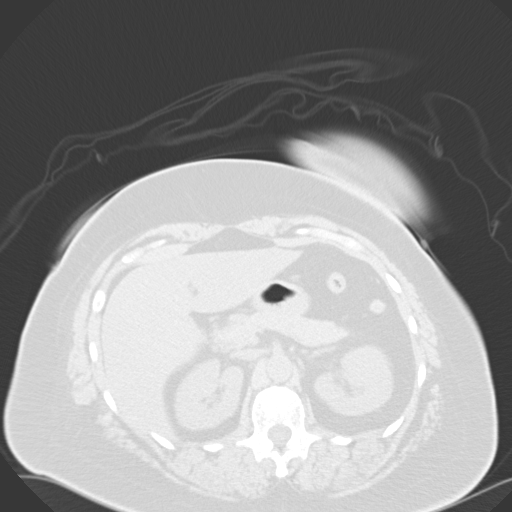
[im 8/56  lung]
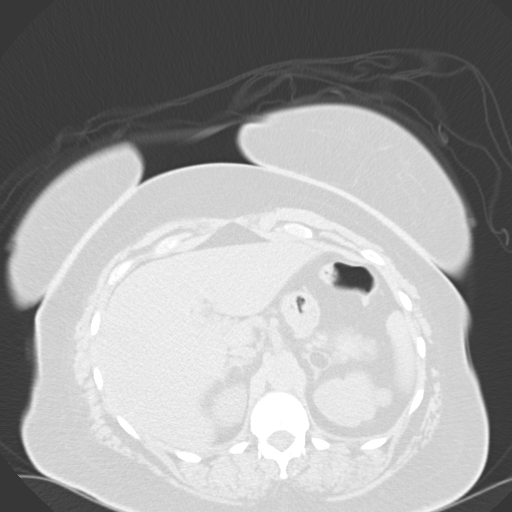
[im 12/56  lung]
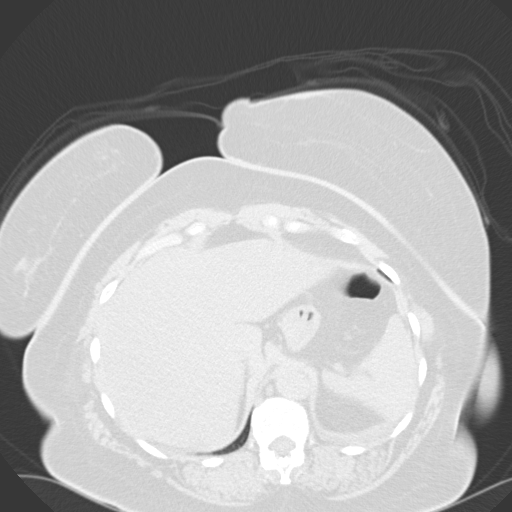
[im 16/56  lung]
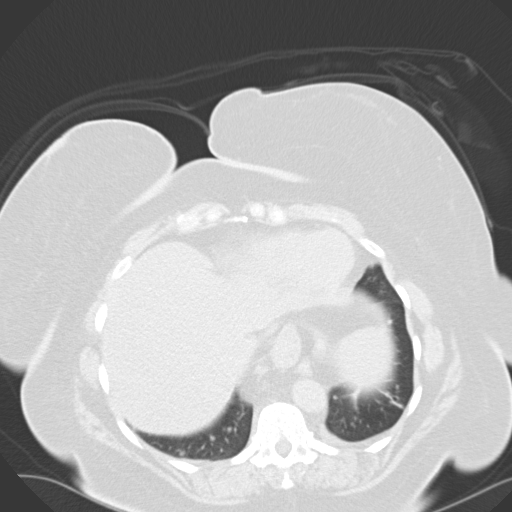
[im 20/56  mediastinal]
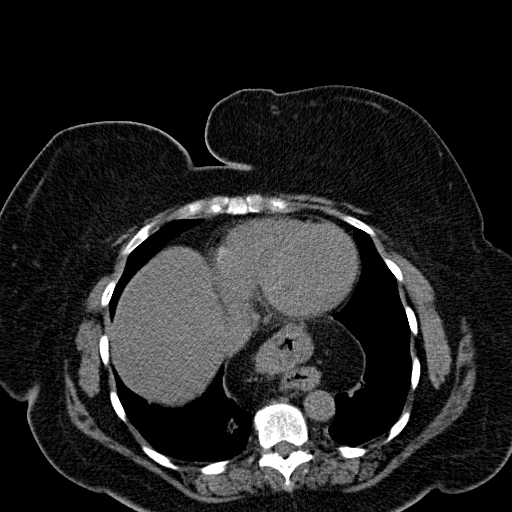
[im 20/56  lung]
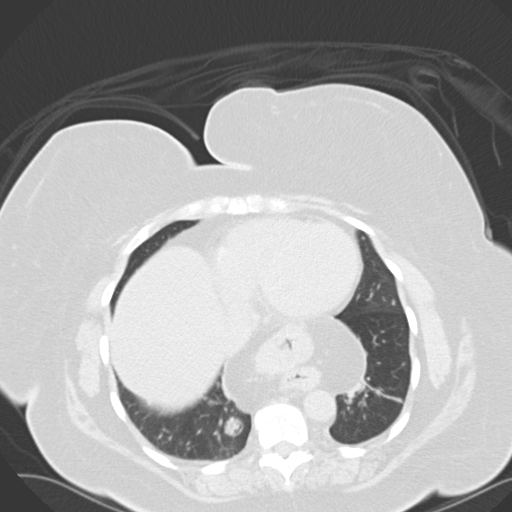
[im 24/56  lung]
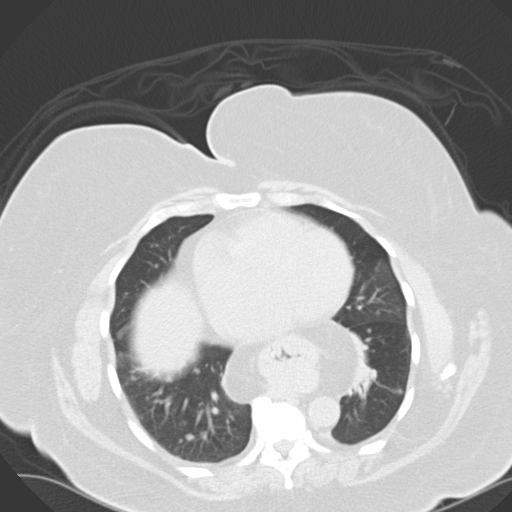
[im 32/56  lung]
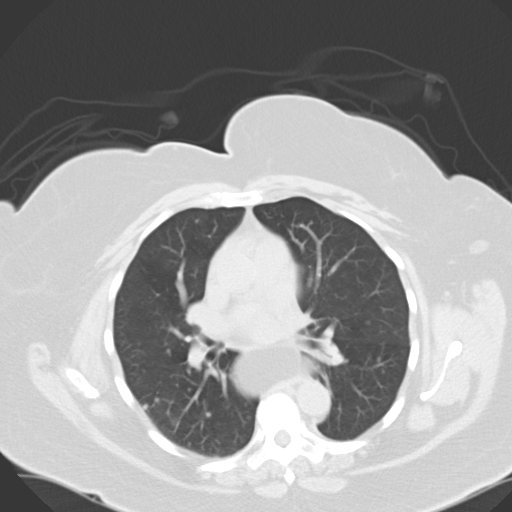
[im 36/56  lung]
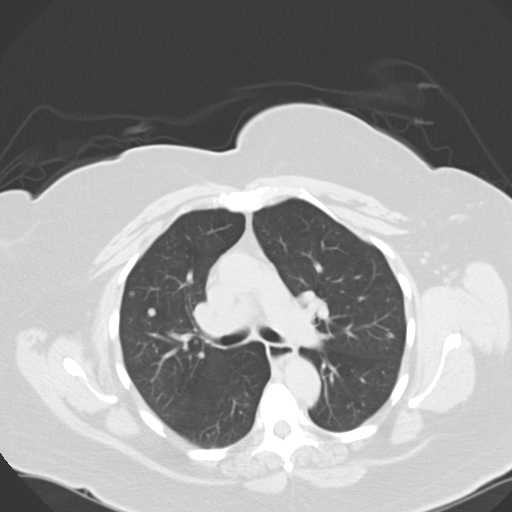
[im 40/56  mediastinal]
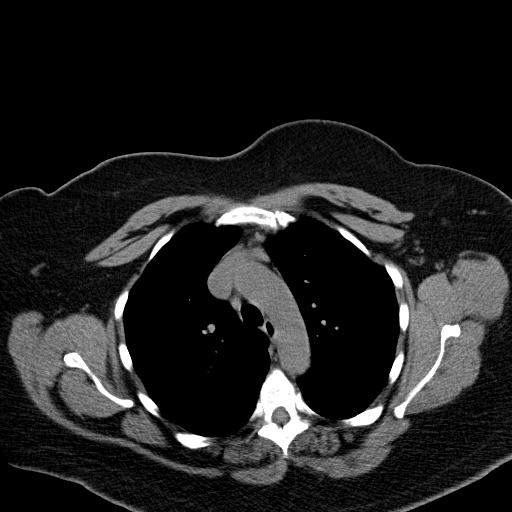
[im 40/56  lung]
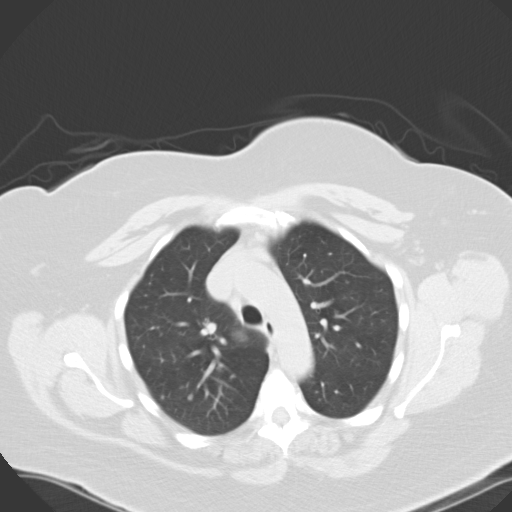
[im 44/56  lung]
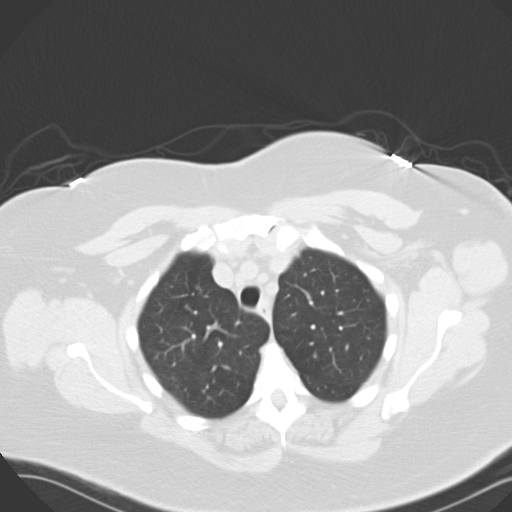
[im 48/56  lung]
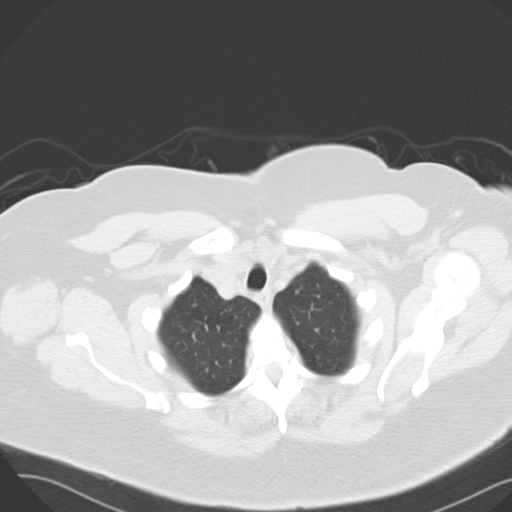
[im 52/56  lung]
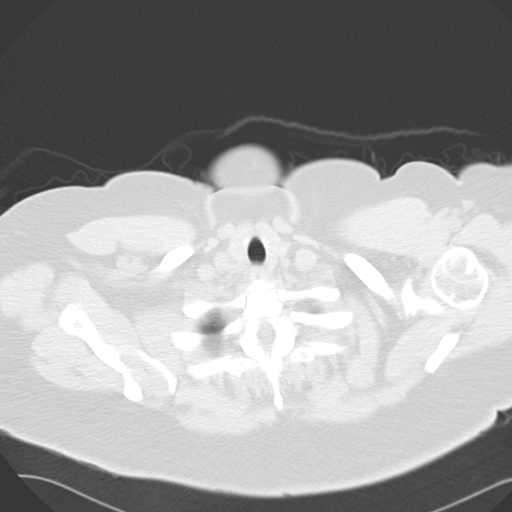

[Series 602: cor · coronal · 0.74mm/px · 3 of 82 slices shown]
[im 17/82  lung]
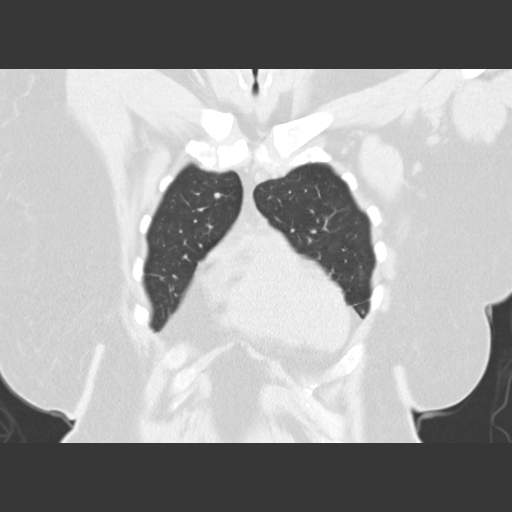
[im 33/82  lung]
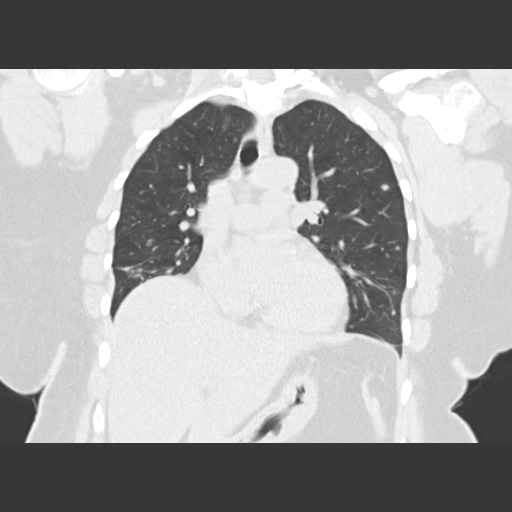
[im 49/82  lung]
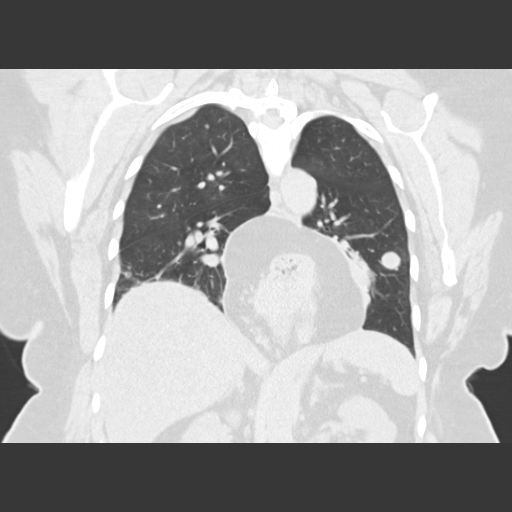

[15 of 36 positions shown; findings below may reference images not displayed]

FINDINGS: Mediastinal lymph nodes are not enlarged by CT size criteria. Hilar
regions are difficult to definitively evaluate without IV contrast.
No axillary adenopathy. Heart is at the upper limits of normal in
size. No pericardial effusion. Moderate hiatal hernia.

Multiple bilateral pulmonary nodules are seen in a hematogenous
distribution. Largest nodule measures 1.4 cm in the left lower lobe,
stable. Scarring is seen in the medial aspects of both lower lobes.
No pleural fluid. Airway is unremarkable.

Incidental imaging of the upper abdomen shows an elevated right
hemidiaphragm. A 1.4 cm exophytic low-attenuation lesion off the
upper pole left kidney is unchanged. A fat density lesion in the
left adrenal gland measures 1.5 cm, stable. No worrisome lytic or
sclerotic lesions. Degenerative changes are seen in the spine.
IMPRESSION: 1. Pulmonary metastatic disease is stable.
2. Left adrenal myelolipoma.

## 2014-03-03 ENCOUNTER — Other Ambulatory Visit: Payer: Self-pay

## 2014-03-03 DIAGNOSIS — Z1231 Encounter for screening mammogram for malignant neoplasm of breast: Secondary | ICD-10-CM

## 2014-03-05 ENCOUNTER — Emergency Department (HOSPITAL_COMMUNITY): Payer: Medicare Other

## 2014-03-05 ENCOUNTER — Encounter (HOSPITAL_COMMUNITY): Payer: Self-pay | Admitting: Emergency Medicine

## 2014-03-05 ENCOUNTER — Emergency Department (HOSPITAL_COMMUNITY)
Admission: EM | Admit: 2014-03-05 | Discharge: 2014-03-05 | Disposition: A | Discharge status: Home / Self Care | Payer: Medicare Other | Attending: Emergency Medicine | Admitting: Emergency Medicine

## 2014-03-05 DIAGNOSIS — R1110 Vomiting, unspecified: Secondary | ICD-10-CM | POA: Diagnosis not present

## 2014-03-05 DIAGNOSIS — IMO0002 Reserved for concepts with insufficient information to code with codable children: Secondary | ICD-10-CM | POA: Insufficient documentation

## 2014-03-05 DIAGNOSIS — Z79899 Other long term (current) drug therapy: Secondary | ICD-10-CM | POA: Insufficient documentation

## 2014-03-05 DIAGNOSIS — Z85118 Personal history of other malignant neoplasm of bronchus and lung: Secondary | ICD-10-CM | POA: Insufficient documentation

## 2014-03-05 DIAGNOSIS — R059 Cough, unspecified: Secondary | ICD-10-CM | POA: Insufficient documentation

## 2014-03-05 DIAGNOSIS — R05 Cough: Secondary | ICD-10-CM | POA: Insufficient documentation

## 2014-03-05 DIAGNOSIS — I10 Essential (primary) hypertension: Secondary | ICD-10-CM | POA: Insufficient documentation

## 2014-03-05 DIAGNOSIS — K219 Gastro-esophageal reflux disease without esophagitis: Secondary | ICD-10-CM | POA: Diagnosis not present

## 2014-03-05 DIAGNOSIS — Z8669 Personal history of other diseases of the nervous system and sense organs: Secondary | ICD-10-CM | POA: Insufficient documentation

## 2014-03-05 DIAGNOSIS — D509 Iron deficiency anemia, unspecified: Secondary | ICD-10-CM | POA: Diagnosis not present

## 2014-03-05 LAB — BASIC METABOLIC PANEL
ANION GAP: 11 (ref 5–15)
BUN: 8 mg/dL (ref 6–23)
CHLORIDE: 101 meq/L (ref 96–112)
CO2: 26 mEq/L (ref 19–32)
CREATININE: 1 mg/dL (ref 0.50–1.10)
Calcium: 9.4 mg/dL (ref 8.4–10.5)
GFR, EST AFRICAN AMERICAN: 67 mL/min — AB (ref >90)
GFR, EST NON AFRICAN AMERICAN: 58 mL/min — AB (ref >90)
Glucose, Bld: 104 mg/dL — ABNORMAL HIGH (ref 70–99)
Potassium: 4.1 mEq/L (ref 3.7–5.3)
Sodium: 138 mEq/L (ref 137–147)

## 2014-03-05 LAB — CBC WITH DIFFERENTIAL/PLATELET
BASOS PCT: 0 % (ref 0–1)
Basophils Absolute: 0 10*3/uL (ref 0.0–0.1)
EOS PCT: 1 % (ref 0–5)
Eosinophils Absolute: 0.1 10*3/uL (ref 0.0–0.7)
HEMATOCRIT: 35.8 % — AB (ref 36.0–46.0)
HEMOGLOBIN: 10.8 g/dL — AB (ref 12.0–15.0)
LYMPHS PCT: 24 % (ref 12–46)
Lymphs Abs: 3 10*3/uL (ref 0.7–4.0)
MCH: 22.6 pg — ABNORMAL LOW (ref 26.0–34.0)
MCHC: 30.2 g/dL (ref 30.0–36.0)
MCV: 74.9 fL — ABNORMAL LOW (ref 78.0–100.0)
MONOS PCT: 5 % (ref 3–12)
Monocytes Absolute: 0.6 10*3/uL (ref 0.1–1.0)
NEUTROS ABS: 8.7 10*3/uL — AB (ref 1.7–7.7)
NEUTROS PCT: 70 % (ref 43–77)
Platelets: 366 10*3/uL (ref 150–400)
RBC: 4.78 MIL/uL (ref 3.87–5.11)
RDW: 16 % — ABNORMAL HIGH (ref 11.5–15.5)
WBC: 12.4 10*3/uL — ABNORMAL HIGH (ref 4.0–10.5)

## 2014-03-05 MED ORDER — FLUTICASONE PROPIONATE 50 MCG/ACT NA SUSP: 2.0000 | Freq: Every day | NASAL | Status: DC

## 2014-03-05 MED ORDER — ONDANSETRON HCL 4 MG PO TABS: 4.0000 mg | ORAL_TABLET | Freq: Four times a day (QID) | ORAL | Status: DC

## 2014-03-05 MED ORDER — BENZONATATE 100 MG PO CAPS: 100.0000 mg | ORAL_CAPSULE | Freq: Three times a day (TID) | ORAL | Status: DC

## 2014-03-05 NOTE — ED Notes (Signed)
She tells me she has had a nagging, dry, hacky cough "for a while now--it just won't go away".  She further states Dr. Benay Spice told her she should see a pulmonologist, but has not done so yet.  She is in no distress.

## 2014-03-05 NOTE — ED Notes (Addendum)
Pt c/o cough for the past 5 days that causes her to vomit. Pt states she feels as if something is stuck in throat and she began to cough to try to get it up. Pt does have lung CA.

## 2014-03-05 NOTE — Discharge Instructions (Signed)
Cough, Adult  A cough is a reflex. It helps you clear your throat and airways. A cough can help heal your body. A cough can last 2 or 3 weeks (acute) or may last more than 8 weeks (chronic). Some common causes of a cough can include an infection, allergy, or a cold. HOME CARE  Only take medicine as told by your doctor.  If given, take your medicines (antibiotics) as told. Finish them even if you start to feel better.  Use a cold steam vaporizer or humidier in your home. This can help loosen thick spit (secretions).  Sleep so you are almost sitting up (semi-upright). Use pillows to do this. This helps reduce coughing.  Rest as needed.  Stop smoking if you smoke. GET HELP RIGHT AWAY IF:  You have yellowish-white fluid (pus) in your thick spit.  Your cough gets worse.  Your medicine does not reduce coughing, and you are losing sleep.  You cough up blood.  You have trouble breathing.  Your pain gets worse and medicine does not help.  You have a fever. MAKE SURE YOU:   Understand these instructions.  Will watch your condition.  Will get help right away if you are not doing well or get worse. Document Released: 04/14/2011 Document Revised: 10/24/2011 Document Reviewed: 04/14/2011 [facility] Patient Information 2015 [place], [place]. This information is not intended to replace advice given to you by your health care provider. Make sure you discuss any questions you have with your health care provider.

## 2014-03-05 NOTE — ED Notes (Signed)
She remains awake, alert and in no distress.

## 2014-03-05 NOTE — ED Provider Notes (Signed)
CSN: [id number]     Arrival date & time 03/05/14  0941 History   First MD Initiated Contact with Patient 03/05/14 1040     Chief Complaint  Patient presents with  . Cough  . Emesis    HPI Pt has been having a cough for a couple of months.  She has seen her PCP as well as her oncologist.  She is not receiving any chemotherapy currently.  Patient states she's noticed some postnasal drainage. The cough is nonproductive. The cough occurs all throughout the day and night. She does not notice any particular pattern as far as only the daytime or in the evening. She has acid reflux but has not noticed any specific relation of her cough to eating. She has had some posttussive emesis and sometimes when she is eating and starts to cough she will vomit. She denies any fevers or chills. She denies any abdominal pain or chest pain. The last time she saw her oncologist he recommended she see a pulmonary doctor. He did not think that her tumors were likely the cause of her cough. Past Medical History  Diagnosis Date  . lung ca     lung ca  . Iron deficiency anemia   . Hypertension   . GERD (gastroesophageal reflux disease)   . Hiatal hernia   . Positional vertigo    Past Surgical History  Procedure Laterality Date  . Abdominal hysterectomy  1995  . Cholecystectomy  2005  . Appendectomy  2005  . Partial colectomy  2005   No family history on file. History  Substance Use Topics  . Smoking status: Never Smoker   . Smokeless tobacco: Not on file  . Alcohol Use: Not on file   OB History   Grav Para Term Preterm Abortions TAB SAB Ect Mult Living                 Review of Systems  All other systems reviewed and are negative.     Allergies  Pineapple  Home Medications   Prior to Admission medications   Medication Sig Start Date End Date Taking? Authorizing Provider  BYSTOLIC 10 MG tablet Take 10 mg by mouth daily. 08/31/12  Yes Historical Provider, MD  estrogens, conjugated, (PREMARIN)  0.3 MG tablet Take 0.3 mg by mouth daily.    Yes Historical Provider, MD  ferrous sulfate 325 (65 FE) MG tablet Take 325 mg by mouth daily with breakfast.   Yes Historical Provider, MD  LORazepam (ATIVAN) 1 MG tablet Take 1 mg by mouth daily as needed for anxiety.    Yes Historical Provider, MD  meclizine (ANTIVERT) 25 MG tablet Take 25 mg by mouth 3 (three) times daily as needed. For dizziness   Yes Historical Provider, MD  omeprazole (PRILOSEC) 20 MG capsule Take 20 mg by mouth daily.   Yes Historical Provider, MD  benzonatate (TESSALON) 100 MG capsule Take 1 capsule (100 mg total) by mouth every 8 (eight) hours. 03/05/14   Dorie Rank, MD  fluticasone (FLONASE) 50 MCG/ACT nasal spray Place 2 sprays into both nostrils daily. 03/05/14   Dorie Rank, MD   BP 160/66  Pulse 77  Temp(Src) 98.2 F (36.8 C) (Oral)  Resp 16  SpO2 97% Physical Exam  Nursing note and vitals reviewed. Constitutional: She appears well-developed and well-nourished. No distress.  HENT:  Head: Normocephalic and atraumatic.  Right Ear: External ear normal.  Left Ear: External ear normal.  Eyes: Conjunctivae are normal. Right eye exhibits no  discharge. Left eye exhibits no discharge. No scleral icterus.  Neck: Neck supple. No tracheal deviation present.  Cardiovascular: Normal rate, regular rhythm and intact distal pulses.   Pulmonary/Chest: Effort normal and breath sounds normal. No stridor. No respiratory distress. She has no wheezes. She has no rales.  Abdominal: Soft. Bowel sounds are normal. She exhibits no distension. There is no tenderness. There is no rebound and no guarding.  Musculoskeletal: She exhibits no edema and no tenderness.  Neurological: She is alert. She has normal strength. No cranial nerve deficit (no facial droop, extraocular movements intact, no slurred speech) or sensory deficit. She exhibits normal muscle tone. She displays no seizure activity. Coordination normal.  Skin: Skin is warm and dry. No  rash noted.  Psychiatric: She has a normal mood and affect.    ED Course  Procedures (including critical care time) Labs Review Labs Reviewed  CBC WITH DIFFERENTIAL - Abnormal; Notable for the following:    WBC 12.4 (*)    Hemoglobin 10.8 (*)    HCT 35.8 (*)    MCV 74.9 (*)    MCH 22.6 (*)    RDW 16.0 (*)    Neutro Abs 8.7 (*)    All other components within normal limits  BASIC METABOLIC PANEL - Abnormal; Notable for the following:    Glucose, Bld 104 (*)    GFR calc non Af Amer 58 (*)    GFR calc Af Amer 67 (*)    All other components within normal limits    Imaging Review Dg Chest 2 View  03/05/2014   CLINICAL DATA:  Cough.  History of lung carcinoma.  EXAM: CHEST  2 VIEW  COMPARISON:  02/13/2014  FINDINGS: Bilateral pulmonary nodules are without significant change from the recent prior study.  Mild linear opacity at the right lung base is also stable, likely scarring. No lung consolidation. No edema or pneumothorax.  Cardiac silhouette is normal in size. Moderate hiatal hernia, also stable. Aorta is uncoiled. No convincing mediastinal or hilar adenopathy.  Bony thorax is intact.  IMPRESSION: No acute cardiopulmonary disease.  No change from the prior study.   Electronically Signed   By: Lajean Manes M.D.   On: 03/05/2014 13:41   Inadvertent delay in seeing the patient while in the ED.  I apologized for the delay.  MDM   Final diagnoses:  Cough    No change in xray nor pneumonia.  Labs unremarkable.  Could be related to sinus drainage.  Possibly acid reflux but she is on antacids.  Will rx nasal steroids.  Tessalon for her cough.  Follow up with PCP and pulmonary    Dorie Rank, MD 03/05/14 1443

## 2014-03-06 ENCOUNTER — Other Ambulatory Visit: Payer: Medicare Other

## 2014-03-06 ENCOUNTER — Ambulatory Visit: Payer: Medicare Other | Admitting: Oncology

## 2014-03-06 ENCOUNTER — Telehealth: Payer: Self-pay | Admitting: Oncology

## 2014-03-06 ENCOUNTER — Other Ambulatory Visit: Payer: Self-pay | Admitting: *Deleted

## 2014-03-06 NOTE — Telephone Encounter (Signed)
Per 07/23 POF r/s pt from 07/23 no show....lft msg/mailed schedule to pt .Marland Kitchen..KJ

## 2014-03-07 ENCOUNTER — Other Ambulatory Visit: Payer: Medicare Other

## 2014-03-07 ENCOUNTER — Ambulatory Visit: Payer: Medicare Other | Admitting: Oncology

## 2014-03-12 ENCOUNTER — Ambulatory Visit
Admission: RE | Admit: 2014-03-12 | Discharge: 2014-03-12 | Disposition: A | Discharge status: Home / Self Care | Payer: Medicare Other | Source: Ambulatory Visit

## 2014-03-12 DIAGNOSIS — Z1231 Encounter for screening mammogram for malignant neoplasm of breast: Secondary | ICD-10-CM

## 2014-03-31 ENCOUNTER — Encounter: Payer: Self-pay | Admitting: *Deleted

## 2014-03-31 NOTE — Progress Notes (Signed)
[place] Work  Clinical Social Work was referred by patient for emotional support and assessment of psychosocial needs.  Clinical Social Worker contacted patient at home to offer support and assess for needs.  Patient has been diagnosed with metastatic neuroendocrine carcinoma and expressed increased emotional stress due to her diagnosis.  CSW validated patients feelings and discussed support resources available.  CSW and patient discussed the Living with Cancer Support Group and Program, and patient was agreeable to referral.  CSW and patient also discussed individual counseling opportunities, and patient was agreeable to a referral to the [facility] counseling interns.  Patient was appreciative of contact and CSW encouraged her to call with any questions or concerns.            Clinical Social Work interventions: Living with [place] with Cancer  Referral to [place], MSW, LCSW, OSW-C Clinical Social Worker [place] [phone]

## 2014-04-01 ENCOUNTER — Encounter: Payer: Self-pay | Admitting: Internal Medicine

## 2014-04-01 ENCOUNTER — Ambulatory Visit (INDEPENDENT_AMBULATORY_CARE_PROVIDER_SITE_OTHER): Payer: Medicare Other | Admitting: Internal Medicine

## 2014-04-01 VITALS — BP 132/80 | HR 82 | Ht 66.0 in | Wt 200.0 lb

## 2014-04-01 DIAGNOSIS — R058 Other specified cough: Secondary | ICD-10-CM | POA: Insufficient documentation

## 2014-04-01 DIAGNOSIS — R05 Cough: Secondary | ICD-10-CM

## 2014-04-01 DIAGNOSIS — R059 Cough, unspecified: Secondary | ICD-10-CM

## 2014-04-01 MED ORDER — FAMOTIDINE 20 MG PO TABS: ORAL_TABLET | ORAL | Status: DC

## 2014-04-01 MED ORDER — TRAMADOL HCL 50 MG PO TABS
ORAL_TABLET | ORAL | Status: DC
Start: 2014-04-01 — End: 2014-05-12

## 2014-04-01 MED ORDER — PREDNISONE 10 MG PO TABS: ORAL_TABLET | ORAL | Status: DC

## 2014-04-01 NOTE — Progress Notes (Signed)
   Subjective:    Patient ID: Ashley Farmer, female    DOB: 06/10/1951   MRN: [id number]  HPI  1 yowf never smoker grew up around cigs from parents but did fine, dx carcinoid 2011 and then cough started May 2015 referred by Dr Tomi Bamberger to pulmonary clinic 04/01/2014   04/01/2014 1st [facility] office visit/ Kharter Brew  Chief Complaint  Patient presents with  . PULMONARY CONSULT    Referred by Dr Tomi Bamberger for Chronic Cough. Recent cxr.   Onset was acute x 3 m prior to OV  asso c with head cold/ sinus drainage/ fluid in R ear/ and persistent dry cough to point of vomit every day more night > develped while on maint prilosec and did not resolve p uri/ sinus symptoms cleared.  Assoc with urinary incont as well. Sob only with cough   Seen in ER 03/05/14 rx tessilon and flonase and no better  No obvious other patterns in day to day or daytime variabilty or assoc   cp or chest tightness, subjective wheeze overt sinus or hb symptoms. No unusual exp hx or h/o childhood pna/ asthma or knowledge of premature birth.  Sleeping ok without nocturnal  or early am exacerbation  of respiratory  c/o's or need for noct saba. Also denies any obvious fluctuation of symptoms with weather or environmental changes or other aggravating or alleviating factors except as outlined above   Current Medications, Allergies, Complete Past Medical History, Past Surgical History, Family History, and Social History were reviewed in [employer] record.                Review of Systems  Constitutional: Positive for appetite change and unexpected weight change. Negative for fever.  HENT: Negative for congestion, dental problem, ear pain, nosebleeds, postnasal drip, rhinorrhea, sinus pressure, sneezing, sore throat and trouble swallowing.   Eyes: Negative for redness and itching.  Respiratory: Positive for cough and shortness of breath. Negative for chest tightness and wheezing.   Cardiovascular: Negative  for palpitations and leg swelling.  Gastrointestinal: Negative for nausea and vomiting.       Acid heart burn  Genitourinary: Negative for dysuria.  Musculoskeletal: Negative for joint swelling.  Skin: Negative for rash.  Neurological: Positive for dizziness. Negative for headaches.       Vertigo  Hematological: Does not bruise/bleed easily.  Psychiatric/Behavioral: Positive for dysphoric mood. The patient is nervous/anxious.        Objective:   Physical Exam  Wt Readings from Last 3 Encounters:  04/01/14 200 lb (90.719 kg)  02/13/14 201 lb 1.6 oz (91.218 kg)  12/09/13 204 lb 8 oz (92.761 kg)      HEENT: nl dentition, turbinates, and orophanx. Nl external ear canals without cough reflex   NECK :  without JVD/Nodes/TM/ nl carotid upstrokes bilaterally   LUNGS: no acc muscle use, clear to A and P bilaterally without cough on insp or exp maneuvers   CV:  RRR  no s3 or murmur or increase in P2, no edema   ABD:  soft and nontender with nl excursion in the supine position. No bruits or organomegaly, bowel sounds nl  MS:  warm without deformities, calf tenderness, cyanosis or clubbing  SKIN: warm and dry without lesions    NEURO:  alert, approp, no deficits    cxr  03/05/14  moderate hernia/ no change MPN      Assessment & Plan:

## 2014-04-01 NOTE — Assessment & Plan Note (Signed)
The most common causes of chronic cough in immunocompetent adults include the following: upper airway cough syndrome (UACS), previously referred to as postnasal drip syndrome (PNDS), which is caused by variety of rhinosinus conditions; (2) asthma; (3) GERD; (4) chronic bronchitis from cigarette smoking or other inhaled environmental irritants; (5) nonasthmatic eosinophilic bronchitis; and (6) bronchiectasis.   These conditions, singly or in combination, have accounted for up to 94% of the causes of chronic cough in prospective studies.   Other conditions have constituted no >6% of the causes in prospective studies These have included bronchogenic carcinoma, chronic interstitial pneumonia, sarcoidosis, left ventricular failure, ACEI-induced cough, and aspiration from a condition associated with pharyngeal dysfunction.    Chronic cough is often simultaneously caused by more than one condition. A single cause has been found from 38 to 82% of the time, multiple causes from 18 to 62%. Multiply caused cough has been the result of three diseases up to 42% of the time.       Based on hx and exam, this is most likely:  Classic Upper airway cough syndrome, so named because it's frequently impossible to sort out how much is  CR/sinusitis with freq throat clearing (which can be related to primary GERD)   vs  causing  secondary (" extra esophageal")  GERD from wide swings in gastric pressure that occur with throat clearing, often  promoting self use of mint and menthol lozenges that reduce the lower esophageal sphincter tone and exacerbate the problem further in a cyclical fashion.   These are the same pts (now being labeled as having "irritable larynx syndrome" by some cough centers) who not infrequently have a history of having failed to tolerate ace inhibitors,  dry powder inhalers or biphosphonates or report having atypical reflux symptoms that don't respond to standard doses of PPI , and are easily confused as  having aecopd or asthma flares by even experienced allergists/ pulmonologists.   The first step is to maximize acid suppression and eliminate cyclical coughing then regroup if the cough persists p 2 weeks.  See instructions for specific recommendations which were reviewed directly with the patient who was given a copy with highlighter outlining the key components.

## 2014-04-01 NOTE — Patient Instructions (Signed)
The key to effective treatment for your cough is eliminating the non-stop cycle of cough you're stuck in long enough to let your airway heal completely and then see if there is anything still making you cough once you stop the cough suppression, but this should take no more than 5 days to figure out  First take delsym two tsp every 12 hours and supplement if needed with  tramadol 50 mg up to 2 every 4 hours to suppress the urge to cough at all or even clear your throat. Swallowing water or using ice chips/non mint and menthol containing candies (such as lifesavers or sugarless jolly ranchers) are also effective.  You should rest your voice and avoid activities that you know make you cough.  Once you have eliminated the cough for 3 straight days try reducing the tramadol first,  then the delsym as tolerated.    Prednisone 10 mg take  4 each am x 2 days,   2 each am x 2 days,  1 each am x 2 days and stop (this is to eliminate allergies and inflammation from coughing)  Prilosec 20 mg Take 2  30-60 min before first meal of the day and Pepcid 20 mg one bedtime plus chlorpheniramine 4 mg x 2 at bedtime (both available over the counter)  until cough is completely gone for at least a week without the need for cough suppression  For drainage take chlortrimeton (chlorpheniramine) 4 mg every 4 hours available over the counter (may cause drowsiness)   GERD (REFLUX)  is an extremely common cause of respiratory symptoms, many times with no significant heartburn at all.    It can be treated with medication, but also with lifestyle changes including avoidance of late meals, excessive alcohol, smoking cessation, and avoid fatty foods, chocolate, peppermint, colas, red wine, and acidic juices such as orange juice.  NO MINT OR MENTHOL PRODUCTS SO NO COUGH DROPS  USE HARD CANDY INSTEAD (jolley ranchers or Stover's or Lifesavers (all available in sugarless versions) NO OIL BASED VITAMINS - use powdered substitutes.      if not better in 2 weeks call [facility] at [phone] for sinus CT

## 2014-04-07 NOTE — Progress Notes (Signed)
Called and scheduled an intake counseling session with client.  [facility] Counseling Intern [phone]

## 2014-04-22 NOTE — Progress Notes (Unsigned)
First intake session with MM on 04/22/14. Client was eager to discuss emotional and physical implications of her diagnosis.   [facility] Counseling Intern  [phone]

## 2014-04-29 ENCOUNTER — Telehealth: Payer: Self-pay | Admitting: Oncology

## 2014-04-29 ENCOUNTER — Ambulatory Visit (HOSPITAL_BASED_OUTPATIENT_CLINIC_OR_DEPARTMENT_OTHER): Payer: Medicare Other | Admitting: Nurse Practitioner

## 2014-04-29 ENCOUNTER — Telehealth: Payer: Self-pay | Admitting: *Deleted

## 2014-04-29 ENCOUNTER — Encounter: Payer: Self-pay | Admitting: Nurse Practitioner

## 2014-04-29 VITALS — BP 127/94 | HR 74 | Temp 98.4°F | Resp 19 | Ht 66.0 in | Wt 196.3 lb

## 2014-04-29 DIAGNOSIS — K219 Gastro-esophageal reflux disease without esophagitis: Secondary | ICD-10-CM | POA: Insufficient documentation

## 2014-04-29 DIAGNOSIS — R058 Other specified cough: Secondary | ICD-10-CM

## 2014-04-29 DIAGNOSIS — R059 Cough, unspecified: Secondary | ICD-10-CM

## 2014-04-29 DIAGNOSIS — C7A1 Malignant poorly differentiated neuroendocrine tumors: Secondary | ICD-10-CM | POA: Insufficient documentation

## 2014-04-29 DIAGNOSIS — R197 Diarrhea, unspecified: Secondary | ICD-10-CM | POA: Insufficient documentation

## 2014-04-29 DIAGNOSIS — R5383 Other fatigue: Secondary | ICD-10-CM | POA: Insufficient documentation

## 2014-04-29 DIAGNOSIS — R530 Neoplastic (malignant) related fatigue: Secondary | ICD-10-CM

## 2014-04-29 DIAGNOSIS — R05 Cough: Secondary | ICD-10-CM

## 2014-04-29 DIAGNOSIS — R5381 Other malaise: Secondary | ICD-10-CM

## 2014-04-29 NOTE — Assessment & Plan Note (Signed)
Patient continues with persistent, chronic, intermittent cough since this past spring.  She states she has tried Claritin in the past; and is currently using Flonase on a regular basis.  She has been evaluated by Dr. Melvyn Novas pulmonologist this summer; and diagnosed with probable upper airway cough syndrome (post nasal drip causing cough).  She has had 2 different chest x-rays this summer as well; revealing no new findings.  Lung nodules previously seen remained stable.  Patient does have plans for her permit her provider for a GI referral to further evaluate the possibility that her cough is related to reflux.  Patient was in agreement in obtaining a restaging CT with contrast of the chest within this next week or so.  She knows to call in the antrum or directly to the emergency department for any worsening symptoms whatsoever.

## 2014-04-29 NOTE — Assessment & Plan Note (Addendum)
Patient is undergoing no active treatment at this time.  Patient will obtain a restaging CT with contrast of the chest within this past week or so.  She will then followup here at the [facility] to review these scans.  Also, patient states that she will reschedule the [facility] counseling appointment for later this week.  Was able to obtain patient's lab results from last week; and reviewed all with the patient.

## 2014-04-29 NOTE — Assessment & Plan Note (Signed)
Patient states that she does continue to experience some diarrhea when she eats heavy meals; but that does not typically take Imodium.  Advised patient to try Imodium if the diarrhea just has become more of a nuisance.

## 2014-04-29 NOTE — Assessment & Plan Note (Signed)
Patient does have a history of a hiatal hernia; and takes both Prilosec and Pepcid on a daily basis.  She has seen her primary care physician within this past week; and was advised to follow up with a gastroenterologist for further evaluation of her chronic cough , reflux symptoms, and possible association with both.  She is awaiting a phone call back for the GI referral at this time.

## 2014-04-29 NOTE — Telephone Encounter (Signed)
   Provider input needed: PT. IS WEAK AND DIZZY.   Reason for call: CONSTANT COUGH  Respiratory: positive for cough, sputum and WHICH IS WHITE, THICK AND FOAMY Neurological: positive for dizziness and weakness   ALLERGIES:  is allergic to pineapple.  Patient last received chemotherapy/ treatment on n/a  Patient was last seen in the office on 02/13/14   Next appt is 03/12/14  Is patient having fevers greater than 100.5?  no   Is patient having uncontrolled pain, or new pain? no   Is patient having new back pain that changes with position (worsens or eases when laying down?)  no   Is patient able to eat and drink? yes, BUT AT TIMES "FEELS LIKE SHE IS CHOKING AND COUGHS HER FOOD BACK UP".      Is patient able to pass stool without difficulty?   yes     Is patient having uncontrolled nausea?  no    patient calls 04/29/2014 with complaint of  Respiratory: positive for cough and sputum Neurological: positive for dizziness and weakness   Summary Based on the above information advised patient to  [place].   Waverly Ferrari M  04/29/2014, 11:38 AM   Background Info  Ashley Farmer   DOB: 1950-12-21   MR#: [id number]   CSN#   [id number] 04/29/2014

## 2014-04-29 NOTE — Progress Notes (Signed)
[place]   Chief Complaint  Patient presents with  . Cough    HPI: Ashley Farmer 63 y.o. female diagnosed with metastatic neuroendocrine carcinoma.  Currently undergoing no active treatment.  Patient was at the [facility] for a [place] counseling appointment; and experienced what she called a" coughing fit".  She then complained of increasing fatigue/weakness; and request to be evaluated here at the [facility].  Patient states she has had a chronic cough since late spring 2015.  She states that this causes only productive of some foamy, clear sputum only.  She denies any hemoptysis.  She denies any chest pain or chest pressure.  She denies any shortness of breath or pain with inspiration.    She states that she does also have a hiatal hernia; and has some reflux symptoms.  She states that she takes two Prilosec tablets in the morning; and a Pepcid at night before bedtime.  She states that she did have a follow up appointment with her primary care provider just this past week; and had labs drawn at that time.  Her primary care provider has suggested that she obtain further evaluation per gastroenterology; and patient is currently awaiting this referral.  Patient has also seen a pulmonologist in the past for further evaluation of her cough.  He diagnosed her with postnasal drip/upper airway cough syndrome.  Patient also states that she does experience some diarrhea she eats heavy meals.  She doesn't typically take Imodium when she has diarrhea.  She denies any nausea or vomiting.  Chills denies any recent fever or chills.  Cough    CURRENT THERAPY: No active treatment plan for patient.   Review of Systems  Respiratory: Positive for cough.     Past Medical History  Diagnosis Date  . lung ca     lung ca  . Iron deficiency anemia   . Hypertension   . GERD (gastroesophageal reflux disease)   . Hiatal hernia   . Positional vertigo      Past Surgical History  Procedure Laterality Date  . Abdominal hysterectomy  1995  . Cholecystectomy  2005  . Appendectomy  2005  . Partial colectomy  2005    has Upper airway cough syndrome; Malignant poorly differentiated neuroendocrine carcinoma, any site; Fatigue; GERD (gastroesophageal reflux disease); and Diarrhea on her problem list.     is allergic to pineapple.    Medication List       This list is accurate as of: 04/29/14  3:00 PM.  Always use your most recent med list.               BYSTOLIC 10 MG tablet  Generic drug:  nebivolol  Take 10 mg by mouth daily.     estrogens (conjugated) 0.3 MG tablet  Commonly known as:  PREMARIN  Take 0.3 mg by mouth daily.     famotidine 20 MG tablet  Commonly known as:  PEPCID  One at bedtime     ferrous sulfate 325 (65 FE) MG tablet  Take 325 mg by mouth daily with breakfast.     fluticasone 50 MCG/ACT nasal spray  Commonly known as:  FLONASE  Place 2 sprays into both nostrils daily.     LORazepam 1 MG tablet  Commonly known as:  ATIVAN  Take 1 mg by mouth daily as needed for anxiety.     meclizine 25 MG tablet  Commonly known as:  ANTIVERT  Take 25 mg  by mouth 3 (three) times daily as needed. For dizziness     omeprazole 20 MG capsule  Commonly known as:  PRILOSEC  Take 20 mg by mouth daily.     ondansetron 4 MG tablet  Commonly known as:  ZOFRAN  Take 1 tablet (4 mg total) by mouth every 6 (six) hours.     predniSONE 10 MG tablet  Commonly known as:  DELTASONE  Take  4 each am x 2 days,   2 each am x 2 days,  1 each am x 2 days and stop     traMADol 50 MG tablet  Commonly known as:  ULTRAM  1-2 every 4 hours as needed for cough or pain         PHYSICAL EXAMINATION  Blood pressure 127/94, pulse 74, temperature 98.4 F (36.9 C), temperature source Oral, resp. rate 19, height 5\' 6"  (1.676 m), weight 196 lb 4.8 oz (89.041 kg), SpO2 99.00%.  Physical Exam  Nursing note and vitals  reviewed. Constitutional: She is oriented to person, place, and time and well-developed, well-nourished, and in no distress. No distress.  HENT:  Head: Normocephalic and atraumatic.  Mouth/Throat: Oropharynx is clear and moist.  Eyes: EOM are normal. Pupils are equal, round, and reactive to light. Right eye exhibits no discharge. Left eye exhibits no discharge. No scleral icterus.  Neck: Normal range of motion. Neck supple. No tracheal deviation present. No thyromegaly present.  Cardiovascular: Normal rate, regular rhythm, normal heart sounds and intact distal pulses.  Exam reveals no friction rub.   No murmur heard. Pulmonary/Chest: Effort normal and breath sounds normal. No respiratory distress. She has no wheezes. She has no rales. She exhibits no tenderness.  Occasional dry cough only on exam.  No shortness of breath on exam.  No respiratory distress.  Abdominal: Soft. Bowel sounds are normal. She exhibits no distension and no mass. There is no tenderness. There is no rebound and no guarding.  Musculoskeletal: Normal range of motion. She exhibits no edema and no tenderness.  Neurological: She is alert and oriented to person, place, and time. Gait normal.  Skin: Skin is warm and dry. No rash noted. No erythema.  Psychiatric: Mood and affect normal.    LABORATORY DATA:. CBC  Lab Results  Component Value Date   WBC 12.4* 03/05/2014   RBC 4.78 03/05/2014   HGB 10.8* 03/05/2014   HCT 35.8* 03/05/2014   PLT 366 03/05/2014   MCV 74.9* 03/05/2014   MCH 22.6* 03/05/2014   MCHC 30.2 03/05/2014   RDW 16.0* 03/05/2014   LYMPHSABS 3.0 03/05/2014   MONOABS 0.6 03/05/2014   EOSABS 0.1 03/05/2014   BASOSABS 0.0 03/05/2014     CMET  Lab Results  Component Value Date   NA 138 03/05/2014   K 4.1 03/05/2014   CL 101 03/05/2014   CO2 26 03/05/2014   GLUCOSE 104* 03/05/2014   BUN 8 03/05/2014   CREATININE 1.00 03/05/2014   CALCIUM 9.4 03/05/2014   PROT 7.8 05/27/2013   ALBUMIN 3.1* 05/27/2013   AST 8  05/27/2013   ALT 7 05/27/2013   ALKPHOS 80 05/27/2013   BILITOT 0.38 05/27/2013   GFRNONAA 58* 03/05/2014   GFRAA 67* 03/05/2014   ASSESSMENT/PLAN:    Upper airway cough syndrome  Assessment & Plan Patient continues with persistent, chronic, intermittent cough since this past spring.  She states she has tried Claritin in the past; and is currently using Flonase on a regular basis.  She has been  evaluated by Dr. Melvyn Novas pulmonologist this summer; and diagnosed with probable upper airway cough syndrome (post nasal drip causing cough).  She has had 2 different chest x-rays this summer as well; revealing no new findings.  Lung nodules previously seen remained stable.  Patient does have plans for her permit her provider for a GI referral to further evaluate the possibility that her cough is related to reflux.  Patient was in agreement in obtaining a restaging CT with contrast of the chest within this next week or so.  She knows to call in the antrum or directly to the emergency department for any worsening symptoms whatsoever.   Malignant poorly differentiated neuroendocrine carcinoma, any site  Assessment & Plan Patient is undergoing no active treatment at this time.  Patient will obtain a restaging CT with contrast of the chest within this past week or so.  She will then followup here at the [facility] to review these scans.  Also, patient states that she will reschedule the [facility] counseling appointment for later this week.  Was able to obtain patient's lab results from last week; and reviewed all with the patient.   Fatigue  Assessment & Plan Patient is complaining of worsening issues with fatigue within the past few weeks.  She associates increased fatigue with her chronic cough.  Patient was encouraged to remain as active as possible.   GERD (gastroesophageal reflux disease)  Assessment & Plan Patient does have a history of a hiatal hernia; and takes both  Prilosec and Pepcid on a daily basis.  She has seen her primary care physician within this past week; and was advised to follow up with a gastroenterologist for further evaluation of her chronic cough , reflux symptoms, and possible association with both.  She is awaiting a phone call back for the GI referral at this time.   Diarrhea  Assessment & Plan Patient states that she does continue to experience some diarrhea when she eats heavy meals; but that does not typically take Imodium.  Advised patient to try Imodium if the diarrhea just has become more of a nuisance.   Patient stated understanding of all instructions; and was in agreement with this plan of care. The patient knows to call the clinic with any problems, questions or concerns.   Review/collaboration with Dr. Benay Spice regarding all aspects of patient's visit today.   Total time spent with patient was 40 minutes;  with greater than 75 percent of that time spent in face to face counseling regarding her symptoms, review of her lab results, and coordination of care and follow up.  Disclaimer: This note was dictated with voice recognition software. Similar sounding words can inadvertently be transcribed and may not be corrected upon review.   Drue Second, NP 04/29/2014

## 2014-04-29 NOTE — Telephone Encounter (Signed)
Per nelsa appt needed for today...done

## 2014-04-29 NOTE — Assessment & Plan Note (Signed)
Patient is complaining of worsening issues with fatigue within the past few weeks.  She associates increased fatigue with her chronic cough.  Patient was encouraged to remain as active as possible.

## 2014-04-30 ENCOUNTER — Telehealth: Payer: Self-pay

## 2014-04-30 ENCOUNTER — Telehealth: Payer: Self-pay | Admitting: Oncology

## 2014-04-30 NOTE — Telephone Encounter (Signed)
Faxed labs from [facility]  Dated 04/23/14 to  CT. CT is scheduled for 05/13/14.

## 2014-04-30 NOTE — Telephone Encounter (Signed)
per pof to sch f/u w/[facility] after scan- pt will get updated sch 9/29

## 2014-05-12 ENCOUNTER — Emergency Department (HOSPITAL_COMMUNITY)
Admission: EM | Admit: 2014-05-12 | Discharge: 2014-05-12 | Disposition: A | Discharge status: Home / Self Care | Payer: Medicare Other | Attending: Emergency Medicine | Admitting: Emergency Medicine

## 2014-05-12 ENCOUNTER — Other Ambulatory Visit: Payer: Self-pay | Admitting: *Deleted

## 2014-05-12 ENCOUNTER — Encounter (HOSPITAL_COMMUNITY): Payer: Self-pay | Admitting: Emergency Medicine

## 2014-05-12 ENCOUNTER — Emergency Department (HOSPITAL_COMMUNITY): Payer: Medicare Other

## 2014-05-12 DIAGNOSIS — C7A1 Malignant poorly differentiated neuroendocrine tumors: Secondary | ICD-10-CM

## 2014-05-12 DIAGNOSIS — K219 Gastro-esophageal reflux disease without esophagitis: Secondary | ICD-10-CM | POA: Diagnosis not present

## 2014-05-12 DIAGNOSIS — R112 Nausea with vomiting, unspecified: Secondary | ICD-10-CM | POA: Insufficient documentation

## 2014-05-12 DIAGNOSIS — R42 Dizziness and giddiness: Secondary | ICD-10-CM | POA: Insufficient documentation

## 2014-05-12 DIAGNOSIS — I10 Essential (primary) hypertension: Secondary | ICD-10-CM | POA: Diagnosis not present

## 2014-05-12 DIAGNOSIS — H53149 Visual discomfort, unspecified: Secondary | ICD-10-CM | POA: Diagnosis not present

## 2014-05-12 DIAGNOSIS — R5381 Other malaise: Secondary | ICD-10-CM | POA: Diagnosis not present

## 2014-05-12 DIAGNOSIS — Z85118 Personal history of other malignant neoplasm of bronchus and lung: Secondary | ICD-10-CM | POA: Insufficient documentation

## 2014-05-12 DIAGNOSIS — Z79899 Other long term (current) drug therapy: Secondary | ICD-10-CM | POA: Insufficient documentation

## 2014-05-12 DIAGNOSIS — D509 Iron deficiency anemia, unspecified: Secondary | ICD-10-CM | POA: Diagnosis not present

## 2014-05-12 DIAGNOSIS — R5383 Other fatigue: Secondary | ICD-10-CM

## 2014-05-12 LAB — URINALYSIS, ROUTINE W REFLEX MICROSCOPIC
Bilirubin Urine: NEGATIVE
Glucose, UA: NEGATIVE mg/dL
Hgb urine dipstick: NEGATIVE
Ketones, ur: NEGATIVE mg/dL
LEUKOCYTES UA: NEGATIVE
Nitrite: NEGATIVE
PROTEIN: NEGATIVE mg/dL
Specific Gravity, Urine: 1.015 (ref 1.005–1.030)
UROBILINOGEN UA: 1 mg/dL (ref 0.0–1.0)
pH: 7.5 (ref 5.0–8.0)

## 2014-05-12 LAB — BASIC METABOLIC PANEL WITH GFR
Anion gap: 13 (ref 5–15)
BUN: 8 mg/dL (ref 6–23)
CO2: 26 meq/L (ref 19–32)
Calcium: 9.2 mg/dL (ref 8.4–10.5)
Chloride: 101 meq/L (ref 96–112)
Creatinine, Ser: 0.97 mg/dL (ref 0.50–1.10)
GFR calc Af Amer: 70 mL/min — ABNORMAL LOW
GFR calc non Af Amer: 60 mL/min — ABNORMAL LOW
Glucose, Bld: 108 mg/dL — ABNORMAL HIGH (ref 70–99)
Potassium: 4.2 meq/L (ref 3.7–5.3)
Sodium: 140 meq/L (ref 137–147)

## 2014-05-12 LAB — CBC
HCT: 34.7 % — ABNORMAL LOW (ref 36.0–46.0)
Hemoglobin: 10.3 g/dL — ABNORMAL LOW (ref 12.0–15.0)
MCH: 22.2 pg — ABNORMAL LOW (ref 26.0–34.0)
MCHC: 29.7 g/dL — ABNORMAL LOW (ref 30.0–36.0)
MCV: 74.8 fL — ABNORMAL LOW (ref 78.0–100.0)
Platelets: 461 K/uL — ABNORMAL HIGH (ref 150–400)
RBC: 4.64 MIL/uL (ref 3.87–5.11)
RDW: 15.8 % — ABNORMAL HIGH (ref 11.5–15.5)
WBC: 10.9 K/uL — ABNORMAL HIGH (ref 4.0–10.5)

## 2014-05-12 MED ORDER — SODIUM CHLORIDE 0.9 % IV BOLUS (SEPSIS)
1000.0000 mL | Freq: Once | INTRAVENOUS | Status: AC
  Administered 2014-05-12: 1000 mL via INTRAVENOUS

## 2014-05-12 MED ORDER — DIPHENHYDRAMINE HCL 50 MG/ML IJ SOLN
25.0000 mg | Freq: Once | INTRAMUSCULAR | Status: AC
  Administered 2014-05-12: 25 mg via INTRAVENOUS
  Filled 2014-05-12: qty 1

## 2014-05-12 MED ORDER — LORAZEPAM 2 MG/ML IJ SOLN
1.0000 mg | Freq: Once | INTRAMUSCULAR | Status: AC
  Administered 2014-05-12: 1 mg via INTRAVENOUS
  Filled 2014-05-12: qty 1

## 2014-05-12 MED ORDER — ONDANSETRON HCL 4 MG/2ML IJ SOLN
4.0000 mg | Freq: Once | INTRAMUSCULAR | Status: AC
  Administered 2014-05-12: 4 mg via INTRAVENOUS
  Filled 2014-05-12: qty 2

## 2014-05-12 MED ORDER — METOCLOPRAMIDE HCL 5 MG/ML IJ SOLN
10.0000 mg | Freq: Once | INTRAMUSCULAR | Status: AC
  Administered 2014-05-12: 10 mg via INTRAVENOUS
  Filled 2014-05-12: qty 2

## 2014-05-12 NOTE — ED Provider Notes (Signed)
CSN: [id number]     Arrival date & time 05/12/14  1445 History   First MD Initiated Contact with Patient 05/12/14 1643     Chief Complaint  Patient presents with  . Dizziness     (Consider location/radiation/quality/duration/timing/severity/associated sxs/prior Treatment) HPI Comments: Patient is a 63 year old female with a past medical history of lung cancer, IDA, hypertension, GERD and positional vertigo who presents to the emergency department complaining of "not feeling well" and dizziness x3 days. Patient reports she is feeling weak and tired. She reports dizziness, worse on laying flat, relieved when standing up. She tried taking meclizine 2 days ago with minimal relief. States this feels somewhat feels like her vertigo, however worse. She describes her dizziness as a lightheaded feeling. Admits to photophobia and "slight headache". Denies focal weakness, numbness or tingling. Admits to associated nausea with a few episodes of nonbloody emesis. Denies fever or chills. States she is scheduled for a head CT and blood work tomorrow from her PCP, however she cannot wait that long.  Patient is a 63 y.o. female presenting with dizziness. The history is provided by the patient.  Dizziness Associated symptoms: nausea and vomiting     Past Medical History  Diagnosis Date  . lung ca     lung ca  . Iron deficiency anemia   . Hypertension   . GERD (gastroesophageal reflux disease)   . Hiatal hernia   . Positional vertigo    Past Surgical History  Procedure Laterality Date  . Abdominal hysterectomy  1995  . Cholecystectomy  2005  . Appendectomy  2005  . Partial colectomy  2005   Family History  Problem Relation Age of Onset  . Congestive Heart Failure Father   . Congestive Heart Failure Other     paternal aunts/uncles  . Clotting disorder Mother   . Stroke Mother    History  Substance Use Topics  . Smoking status: Never Smoker   . Smokeless tobacco: Not on file  . Alcohol Use:  No   OB History   Grav Para Term Preterm Abortions TAB SAB Ect Mult Living                 Review of Systems  Constitutional: Positive for fatigue.  Eyes: Positive for photophobia.  Gastrointestinal: Positive for nausea and vomiting.  Neurological: Positive for dizziness and weakness.  All other systems reviewed and are negative.     Allergies  Pineapple  Home Medications   Prior to Admission medications   Medication Sig Start Date End Date Taking? Authorizing Provider  BYSTOLIC 10 MG tablet Take 10 mg by mouth daily. 08/31/12  Yes Historical Provider, MD  estrogens, conjugated, (PREMARIN) 0.3 MG tablet Take 0.3 mg by mouth daily.    Yes Historical Provider, MD  ferrous sulfate 325 (65 FE) MG tablet Take 325 mg by mouth daily with breakfast.   Yes Historical Provider, MD  fluticasone (FLONASE) 50 MCG/ACT nasal spray Place 2 sprays into both nostrils daily as needed for allergies or rhinitis.   Yes Historical Provider, MD  LORazepam (ATIVAN) 1 MG tablet Take 1 mg by mouth daily as needed for anxiety.    Yes Historical Provider, MD  meclizine (ANTIVERT) 25 MG tablet Take 25 mg by mouth 3 (three) times daily as needed. For dizziness   Yes Historical Provider, MD  omeprazole (PRILOSEC) 20 MG capsule Take 20 mg by mouth daily.   Yes Historical Provider, MD  ondansetron (ZOFRAN) 4 MG tablet Take 1 tablet (4  mg total) by mouth every 6 (six) hours. 03/05/14  Yes Dorie Rank, MD  predniSONE (DELTASONE) 10 MG tablet Take  4 each am x 2 days,   2 each am x 2 days,  1 each am x 2 days and stop 04/01/14   Tanda Rockers, MD   BP 128/71  Pulse 81  Temp(Src) 98.4 F (36.9 C) (Oral)  Resp 14  SpO2 96% Physical Exam  Nursing note and vitals reviewed. Constitutional: She is oriented to person, place, and time. She appears well-developed and well-nourished. No distress.  Wearing sunglasses.  HENT:  Head: Normocephalic and atraumatic.  Mouth/Throat: Oropharynx is clear and moist.  Eyes:  Conjunctivae and EOM are normal. Pupils are equal, round, and reactive to light.  Neck: Normal range of motion. Neck supple. No JVD present.  Cardiovascular: Normal rate, regular rhythm, normal heart sounds and intact distal pulses.   No extremity edema.  Pulmonary/Chest: Effort normal and breath sounds normal. No respiratory distress.  Abdominal: Soft. Bowel sounds are normal. There is no tenderness.  Musculoskeletal: Normal range of motion. She exhibits no edema.  Neurological: She is alert and oriented to person, place, and time. She has normal strength. No cranial nerve deficit or sensory deficit. Coordination and gait normal.  Dizziness exacerbated on laying flat. Speech fluent, goal oriented. Moves limbs without ataxia. Equal grip strength bilateral.  Skin: Skin is warm and dry. No rash noted. She is not diaphoretic.  Psychiatric: She has a normal mood and affect. Her behavior is normal.    ED Course  Procedures (including critical care time) Labs Review Labs Reviewed  CBC - Abnormal; Notable for the following:    WBC 10.9 (*)    Hemoglobin 10.3 (*)    HCT 34.7 (*)    MCV 74.8 (*)    MCH 22.2 (*)    MCHC 29.7 (*)    RDW 15.8 (*)    Platelets 461 (*)    All other components within normal limits  BASIC METABOLIC PANEL - Abnormal; Notable for the following:    Glucose, Bld 108 (*)    GFR calc non Af Amer 60 (*)    GFR calc Af Amer 70 (*)    All other components within normal limits  URINALYSIS, ROUTINE W REFLEX MICROSCOPIC    Imaging Review Ct Head Wo Contrast  05/12/2014   CLINICAL DATA:  Dizziness.  EXAM: CT HEAD WITHOUT CONTRAST  TECHNIQUE: Contiguous axial images were obtained from the base of the skull through the vertex without intravenous contrast.  COMPARISON:  07/25/2013  FINDINGS: Prominence of the sulci in noted compatible with brain atrophy. Mild low attenuation within the subcortical and periventricular white matter suggests chronic microvascular disease. No  evidence for acute intracranial hemorrhage, infarct or mass. The paranasal sinuses and mastoid air cells are clear. The skull is intact.  IMPRESSION: 1. No acute intracranial abnormalities. 2. Brain atrophy.   Electronically Signed   By: Kerby Moors M.D.   On: 05/12/2014 18:36     EKG Interpretation   Date/Time:  Monday May 12 2014 15:00:18 EDT Ventricular Rate:  73 PR Interval:  128 QRS Duration: 76 QT Interval:  377 QTC Calculation: 415 R Axis:   39 Text Interpretation:  Ectopic atrial rhythm Baseline wander in lead(s) II  III aVF No significant change was found Confirmed by [facility]  MD, TREY  ([id number]) on 05/12/2014 6:26:14 PM      MDM   Final diagnoses:  Vertigo   Patient with  history of vertigo presenting with dizziness. She is nontoxic appearing and in no apparent distress. Afebrile, vital signs stable. No focal neurologic deficits. Patient is insistent that she received a head CT as she has been scheduled for tomorrow. She states she does not want to go home and needs to be admitted to the hospital because she does not feel good. I discussed with her that without any emergent findings, she may not need hospital admission. Head CT pending.  Head CT without any acute findings. Labs with mild leukocytosis of 10.9, otherwise no acute findings. Ambulates without difficulty. Pt upset she cannot receive chest CT that is scheduled for tomorrow as her routine 6 month chest CT. Explained non-emergent cause for this. Pt given zofran, ativan, reglan, benadryl and fluids for her symptoms with only mild relief. I do not feel any emergent need for admission for pt. Neurologically intact. Stable for d/c with PCP f/u. Return precautions given.  Case discussed with attending Dr. Doy Mince who also evaluated patient and agrees with plan of care.   Illene Labrador, PA-C 05/12/14 2141

## 2014-05-12 NOTE — ED Notes (Signed)
Bed: [facility]PT2 Expected date:  Expected time:  Means of arrival:  Comments: ems 

## 2014-05-12 NOTE — ED Notes (Signed)
2 RNs attempted to start PIV, no success. Another RN to attempt ultrasound insertion.

## 2014-05-12 NOTE — ED Notes (Signed)
Per pt, doesn't feel well-weakness, nauseated

## 2014-05-12 NOTE — ED Notes (Signed)
Per EMS-vertigo for 4 days

## 2014-05-12 NOTE — Discharge Instructions (Signed)
Benign Positional Vertigo Vertigo means you feel like you or your surroundings are moving when they are not. Benign positional vertigo is the most common form of vertigo. Benign means that the cause of your condition is not serious. Benign positional vertigo is more common in older adults. CAUSES  Benign positional vertigo is the result of an upset in the labyrinth system. This is an area in the middle ear that helps control your balance. This may be caused by a viral infection, head injury, or repetitive motion. However, often no specific cause is found. SYMPTOMS  Symptoms of benign positional vertigo occur when you move your head or eyes in different directions. Some of the symptoms may include:  Loss of balance and falls.  Vomiting.  Blurred vision.  Dizziness.  Nausea.  Involuntary eye movements (nystagmus). DIAGNOSIS  Benign positional vertigo is usually diagnosed by physical exam. If the specific cause of your benign positional vertigo is unknown, your caregiver may perform imaging tests, such as magnetic resonance imaging (MRI) or computed tomography (CT). TREATMENT  Your caregiver may recommend movements or procedures to correct the benign positional vertigo. Medicines such as meclizine, benzodiazepines, and medicines for nausea may be used to treat your symptoms. In rare cases, if your symptoms are caused by certain conditions that affect the inner ear, you may need surgery. HOME CARE INSTRUCTIONS   Follow your caregiver's instructions.  Move slowly. Do not make sudden body or head movements.  Avoid driving.  Avoid operating heavy machinery.  Avoid performing any tasks that would be dangerous to you or others during a vertigo episode.  Drink enough fluids to keep your urine clear or pale yellow. SEEK IMMEDIATE MEDICAL CARE IF:   You develop problems with walking, weakness, numbness, or using your arms, hands, or legs.  You have difficulty speaking.  You develop  severe headaches.  Your nausea or vomiting continues or gets worse.  You develop visual changes.  Your family or friends notice any behavioral changes.  Your condition gets worse.  You have a fever.  You develop a stiff neck or sensitivity to light. MAKE SURE YOU:   Understand these instructions.  Will watch your condition.  Will get help right away if you are not doing well or get worse. Document Released: 05/09/2006 Document Revised: 10/24/2011 Document Reviewed: 04/21/2011 ExitCare Patient Information 2015 [employer]. This information is not intended to replace advice given to you by your health care provider. Make sure you discuss any questions you have with your health care provider.    

## 2014-05-13 ENCOUNTER — Ambulatory Visit (HOSPITAL_COMMUNITY)
Admission: RE | Admit: 2014-05-13 | Discharge: 2014-05-13 | Disposition: A | Discharge status: Home / Self Care | Payer: Medicare Other | Source: Ambulatory Visit | Attending: Nurse Practitioner | Admitting: Nurse Practitioner

## 2014-05-13 ENCOUNTER — Other Ambulatory Visit: Payer: Medicare Other

## 2014-05-13 ENCOUNTER — Telehealth: Payer: Self-pay | Admitting: *Deleted

## 2014-05-13 ENCOUNTER — Ambulatory Visit (HOSPITAL_BASED_OUTPATIENT_CLINIC_OR_DEPARTMENT_OTHER): Payer: Medicare Other | Admitting: Nurse Practitioner

## 2014-05-13 ENCOUNTER — Ambulatory Visit (HOSPITAL_BASED_OUTPATIENT_CLINIC_OR_DEPARTMENT_OTHER): Payer: Medicare Other

## 2014-05-13 ENCOUNTER — Other Ambulatory Visit: Payer: Self-pay | Admitting: *Deleted

## 2014-05-13 VITALS — BP 160/76 | HR 82 | Temp 99.3°F | Resp 20 | Ht 66.0 in | Wt 195.1 lb

## 2014-05-13 DIAGNOSIS — F419 Anxiety disorder, unspecified: Secondary | ICD-10-CM

## 2014-05-13 DIAGNOSIS — C7A1 Malignant poorly differentiated neuroendocrine tumors: Secondary | ICD-10-CM | POA: Diagnosis not present

## 2014-05-13 DIAGNOSIS — C7800 Secondary malignant neoplasm of unspecified lung: Secondary | ICD-10-CM | POA: Diagnosis not present

## 2014-05-13 DIAGNOSIS — R1110 Vomiting, unspecified: Secondary | ICD-10-CM

## 2014-05-13 DIAGNOSIS — R058 Other specified cough: Secondary | ICD-10-CM

## 2014-05-13 DIAGNOSIS — R630 Anorexia: Secondary | ICD-10-CM

## 2014-05-13 DIAGNOSIS — R530 Neoplastic (malignant) related fatigue: Secondary | ICD-10-CM

## 2014-05-13 DIAGNOSIS — K449 Diaphragmatic hernia without obstruction or gangrene: Secondary | ICD-10-CM | POA: Diagnosis not present

## 2014-05-13 DIAGNOSIS — R059 Cough, unspecified: Secondary | ICD-10-CM | POA: Insufficient documentation

## 2014-05-13 DIAGNOSIS — R1115 Cyclical vomiting syndrome unrelated to migraine: Secondary | ICD-10-CM

## 2014-05-13 DIAGNOSIS — F411 Generalized anxiety disorder: Secondary | ICD-10-CM

## 2014-05-13 DIAGNOSIS — H8110 Benign paroxysmal vertigo, unspecified ear: Secondary | ICD-10-CM

## 2014-05-13 DIAGNOSIS — R05 Cough: Secondary | ICD-10-CM | POA: Diagnosis not present

## 2014-05-13 DIAGNOSIS — E860 Dehydration: Secondary | ICD-10-CM

## 2014-05-13 DIAGNOSIS — R5383 Other fatigue: Secondary | ICD-10-CM

## 2014-05-13 DIAGNOSIS — R5381 Other malaise: Secondary | ICD-10-CM

## 2014-05-13 MED ORDER — IOHEXOL 300 MG/ML  SOLN
80.0000 mL | Freq: Once | INTRAMUSCULAR | Status: AC | PRN
  Administered 2014-05-13: 80 mL via INTRAVENOUS

## 2014-05-13 MED ORDER — MIRTAZAPINE 15 MG PO TABS: 15.0000 mg | ORAL_TABLET | Freq: Every day | ORAL | Status: DC

## 2014-05-13 MED ORDER — ONDANSETRON 8 MG/50ML IVPB (CHCC): 8.0000 mg | Freq: Once | INTRAVENOUS | Status: DC

## 2014-05-13 MED ORDER — SODIUM CHLORIDE 0.9 % IV SOLN
Freq: Once | INTRAVENOUS | Status: AC
  Administered 2014-05-13: 16:00:00 via INTRAVENOUS

## 2014-05-13 MED ORDER — ONDANSETRON 8 MG/NS 50 ML IVPB
INTRAVENOUS | Status: AC
  Filled 2014-05-13: qty 8

## 2014-05-13 NOTE — Telephone Encounter (Signed)
Received 2 messages from pt reporting nausea and dizziness. Having up to 3 BM in 24 hours. Went to ED 9/28 and they were unable to find reason for her symptoms. Pt reports she is having facial flushing. Recommended she proceed with CT as scheduled. Will discuss with providers.

## 2014-05-13 NOTE — Progress Notes (Signed)
Patient declines to take Zofran,.

## 2014-05-13 NOTE — Patient Instructions (Signed)
Dehydration, Adult Dehydration is when you lose more fluids from the body than you take in. Vital organs like the kidneys, brain, and heart cannot function without a proper amount of fluids and salt. Any loss of fluids from the body can cause dehydration.  CAUSES   Vomiting.  Diarrhea.  Excessive sweating.  Excessive urine output.  Fever. SYMPTOMS  Mild dehydration  Thirst.  Dry lips.  Slightly dry mouth. Moderate dehydration  Very dry mouth.  Sunken eyes.  Skin does not bounce back quickly when lightly pinched and released.  Dark urine and decreased urine production.  Decreased tear production.  Headache. Severe dehydration  Very dry mouth.  Extreme thirst.  Rapid, weak pulse (more than 100 beats per minute at rest).  Cold hands and feet.  Not able to sweat in spite of heat and temperature.  Rapid breathing.  Blue lips.  Confusion and lethargy.  Difficulty being awakened.  Minimal urine production.  No tears. DIAGNOSIS  Your caregiver will diagnose dehydration based on your symptoms and your exam. Blood and urine tests will help confirm the diagnosis. The diagnostic evaluation should also identify the cause of dehydration. TREATMENT  Treatment of mild or moderate dehydration can often be done at home by increasing the amount of fluids that you drink. It is best to drink small amounts of fluid more often. Drinking too much at one time can make vomiting worse. Refer to the home care instructions below. Severe dehydration needs to be treated at the hospital where you will probably be given intravenous (IV) fluids that contain water and electrolytes. HOME CARE INSTRUCTIONS   Ask your caregiver about specific rehydration instructions.  Drink enough fluids to keep your urine clear or pale yellow.  Drink small amounts frequently if you have nausea and vomiting.  Eat as you normally do.  Avoid:  Foods or drinks high in sugar.  Carbonated  drinks.  Juice.  Extremely hot or cold fluids.  Drinks with caffeine.  Fatty, greasy foods.  Alcohol.  Tobacco.  Overeating.  Gelatin desserts.  Wash your hands well to avoid spreading bacteria and viruses.  Only take over-the-counter or prescription medicines for pain, discomfort, or fever as directed by your caregiver.  Ask your caregiver if you should continue all prescribed and over-the-counter medicines.  Keep all follow-up appointments with your caregiver. SEEK MEDICAL CARE IF:  You have abdominal pain and it increases or stays in one area (localizes).  You have a rash, stiff neck, or severe headache.  You are irritable, sleepy, or difficult to awaken.  You are weak, dizzy, or extremely thirsty. SEEK IMMEDIATE MEDICAL CARE IF:   You are unable to keep fluids down or you get worse despite treatment.  You have frequent episodes of vomiting or diarrhea.  You have blood or green matter (bile) in your vomit.  You have blood in your stool or your stool looks black and tarry.  You have not urinated in 6 to 8 hours, or you have only urinated a small amount of very dark urine.  You have a fever.  You faint. MAKE SURE YOU:   Understand these instructions.  Will watch your condition.  Will get help right away if you are not doing well or get worse. Document Released: 08/01/2005 Document Revised: 10/24/2011 Document Reviewed: 03/21/2011 ExitCare Patient Information 2015 [employer]. This information is not intended to replace advice given to you by your health care provider. Make sure you discuss any questions you have with your health care   provider.  

## 2014-05-13 NOTE — Telephone Encounter (Signed)
Pt called stating that she still has same recurrent symptoms of dizziness, nausea/vomiting, cannot eat, problem with swallowing.  Pt stated she went to ER yesterday and was given IV antiemetics with relief.  Pt has appt with GI md on  Oct 13. Pt has CT scan today at 130 pm.  Pt would like to know what to do.  Ashley Mac, NP notified.

## 2014-05-13 NOTE — Telephone Encounter (Signed)
Spoke with pt and informed pt that Jenny Reichmann, NP could see pt today at 230pm after CT scan.   Pt voiced understanding.

## 2014-05-14 NOTE — ED Provider Notes (Signed)
Medical screening examination/treatment/procedure(s) were conducted as a shared visit with non-physician practitioner(s) and myself.  I personally evaluated the patient during the encounter.   EKG Interpretation   Date/Time:  Monday May 12 2014 15:00:18 EDT Ventricular Rate:  73 PR Interval:  128 QRS Duration: 76 QT Interval:  377 QTC Calculation: 415 R Axis:   39 Text Interpretation:  Ectopic atrial rhythm Baseline wander in lead(s) II  III aVF No significant change was found Confirmed by [facility]  MD, TREY  ([id number]) on 05/12/2014 6:26:14 PM      63 yo female presenting with dizziness described as lightheadedness.  On exam, well appearing, nontoxic, not distressed, normal respiratory effort, normal perfusion, lungs CTAB, heart sounds normal with RRR.  No clear cause of dizziness found, but pt symptomatically improved with antiemetics and fluids.  Advised close outpatient follow up and have given return precautions.   Clinical Impression: Dizziness    Houston Siren III, MD 05/14/14 1425

## 2014-05-15 ENCOUNTER — Telehealth: Payer: Self-pay | Admitting: Oncology

## 2014-05-15 ENCOUNTER — Ambulatory Visit: Payer: Medicare Other | Admitting: Oncology

## 2014-05-15 ENCOUNTER — Encounter: Payer: Self-pay | Admitting: Nurse Practitioner

## 2014-05-15 ENCOUNTER — Telehealth: Payer: Self-pay | Admitting: *Deleted

## 2014-05-15 ENCOUNTER — Other Ambulatory Visit: Payer: Medicare Other

## 2014-05-15 DIAGNOSIS — H8110 Benign paroxysmal vertigo, unspecified ear: Secondary | ICD-10-CM | POA: Insufficient documentation

## 2014-05-15 DIAGNOSIS — R112 Nausea with vomiting, unspecified: Secondary | ICD-10-CM | POA: Insufficient documentation

## 2014-05-15 DIAGNOSIS — R630 Anorexia: Secondary | ICD-10-CM | POA: Insufficient documentation

## 2014-05-15 DIAGNOSIS — E860 Dehydration: Secondary | ICD-10-CM | POA: Insufficient documentation

## 2014-05-15 DIAGNOSIS — F419 Anxiety disorder, unspecified: Secondary | ICD-10-CM | POA: Insufficient documentation

## 2014-05-15 NOTE — Progress Notes (Signed)
[place]   Chief Complaint  Patient presents with  . Dizziness    HPI: Ashley Farmer 63 y.o. female diagnosed with neuroendocrine tumor.  Patient is under no active treatment.  Patient called the cancer Center today requesting an urgent care visit.  I she continues to complain of dizziness.  She states that the chronic dizziness causes her frequent nausea and vomiting.  She also has little appetite; he feels dehydrated.  She is complaining of continued fatigue.  She states that she does have frequent feel anxious regarding her cancer diagnosis as well.  Patient presented to the emergency department just yesterday late afternoon of for the same type symptoms.  The patient had labs, a urinalysis, an  EKG, and a head CT-all with no acute findings.  Patient received 1 L normal saline IV fluid hydration while in the emergency department.  She also was given Reglan, Zofran, Ativan, and Benadryl-which patient states "made her feel even worse."  Patient states she has been evaluated multiple times in the past for her dizziness by an ENT physician.  She states that the ENT physician and was in agreement with the benign positional vertigo diagnosis.  She has tried meclizine in the past; but it did not help.  Dizziness    CURRENT THERAPY: No active treatment plan for patient.   Review of Systems  Neurological: Positive for dizziness.    Past Medical History  Diagnosis Date  . lung ca     lung ca  . Iron deficiency anemia   . Hypertension   . GERD (gastroesophageal reflux disease)   . Hiatal hernia   . Positional vertigo     Past Surgical History  Procedure Laterality Date  . Abdominal hysterectomy  1995  . Cholecystectomy  2005  . Appendectomy  2005  . Partial colectomy  2005    has Upper airway cough syndrome; Malignant poorly differentiated neuroendocrine carcinoma; Fatigue; GERD (gastroesophageal reflux disease); Diarrhea; Benign positional vertigo; Nausea  with vomiting; Dehydration; Anorexia; and Anxiety on her problem list.     is allergic to pineapple.    Medication List       This list is accurate as of: 05/13/14 11:59 PM.  Always use your most recent med list.               BYSTOLIC 10 MG tablet  Generic drug:  nebivolol  Take 10 mg by mouth daily.     estrogens (conjugated) 0.3 MG tablet  Commonly known as:  PREMARIN  Take 0.3 mg by mouth daily.     ferrous sulfate 325 (65 FE) MG tablet  Take 325 mg by mouth daily with breakfast.     fluticasone 50 MCG/ACT nasal spray  Commonly known as:  FLONASE  Place 2 sprays into both nostrils daily as needed for allergies or rhinitis.     LORazepam 1 MG tablet  Commonly known as:  ATIVAN  Take 1 mg by mouth daily as needed for anxiety.     meclizine 25 MG tablet  Commonly known as:  ANTIVERT  Take 25 mg by mouth 3 (three) times daily as needed. For dizziness     mirtazapine 15 MG tablet  Commonly known as:  REMERON  Take 1 tablet (15 mg total) by mouth at bedtime.     omeprazole 20 MG capsule  Commonly known as:  PRILOSEC  Take 20 mg by mouth daily.     ondansetron 4 MG tablet  Commonly known as:  ZOFRAN  Take 1 tablet (4 mg total) by mouth every 6 (six) hours.     predniSONE 10 MG tablet  Commonly known as:  DELTASONE  Take  4 each am x 2 days,   2 each am x 2 days,  1 each am x 2 days and stop         PHYSICAL EXAMINATION  Blood pressure 160/76, pulse 82, temperature 99.3 F (37.4 C), temperature source Oral, resp. rate 20, height 5' 6"  (1.676 m), weight 195 lb 1.6 oz (88.497 kg), SpO2 100.00%.  Physical Exam  Nursing note and vitals reviewed. Constitutional: She is oriented to person, place, and time and well-developed, well-nourished, and in no distress. No distress.  HENT:  Head: Normocephalic.  Left Ear: External ear normal.  Nose: Nose normal.  Mouth/Throat: Oropharynx is clear and moist. No oropharyngeal exudate.  Right ear with trace amount of  effusion behind the TM.  Eyes: Conjunctivae and EOM are normal. Pupils are equal, round, and reactive to light. Right eye exhibits no discharge. Left eye exhibits no discharge. No scleral icterus.  Neck: Normal range of motion. Neck supple. No JVD present. No tracheal deviation present. No thyromegaly present.  Cardiovascular: Normal rate, regular rhythm, normal heart sounds and intact distal pulses.  Exam reveals no friction rub.   No murmur heard. Pulmonary/Chest: Effort normal and breath sounds normal. No respiratory distress. She has no wheezes.  Abdominal: Soft. Bowel sounds are normal. She exhibits no distension. There is no tenderness. There is no rebound and no guarding.  Musculoskeletal: Normal range of motion. She exhibits no edema and no tenderness.  Lymphadenopathy:    She has no cervical adenopathy.  Neurological: She is alert and oriented to person, place, and time. She has normal reflexes. She exhibits normal muscle tone.  Patient was in a wheelchair during this exam.  Skin: Skin is warm and dry. No rash noted. No erythema.  Psychiatric: Affect normal.    LABORATORY DATA:. CBC  Lab Results  Component Value Date   WBC 10.9* 05/12/2014   RBC 4.64 05/12/2014   HGB 10.3* 05/12/2014   HCT 34.7* 05/12/2014   PLT 461* 05/12/2014   MCV 74.8* 05/12/2014   MCH 22.2* 05/12/2014   MCHC 29.7* 05/12/2014   RDW 15.8* 05/12/2014   LYMPHSABS 3.0 03/05/2014   MONOABS 0.6 03/05/2014   EOSABS 0.1 03/05/2014   BASOSABS 0.0 03/05/2014     CMET  Lab Results  Component Value Date   NA 140 05/12/2014   K 4.2 05/12/2014   CL 101 05/12/2014   CO2 26 05/12/2014   GLUCOSE 108* 05/12/2014   BUN 8 05/12/2014   CREATININE 0.97 05/12/2014   CALCIUM 9.2 05/12/2014   PROT 7.8 05/27/2013   ALBUMIN 3.1* 05/27/2013   AST 8 05/27/2013   ALT 7 05/27/2013   ALKPHOS 80 05/27/2013   BILITOT 0.38 05/27/2013   GFRNONAA 60* 05/12/2014   GFRAA 70* 05/12/2014   Final result Visible to patient: This result is  viewable by the patient in [place]. Next appt: 05/20/2014 at 11:30 AM in Oncology [facility], Dominica Severin, MD)             Ref Range 3d ago  68yrago      Color, Urine YELLOW  YELLOW   YELLOW       APPearance CLEAR  CLEAR   CLEAR       Specific Gravity, Urine 1.005 - 1.030  1.015   1.010  pH 5.0 - 8.0  7.5   6.5       Glucose, UA NEGATIVE mg/dL NEGATIVE   NEGATIVE       Hgb urine dipstick NEGATIVE  NEGATIVE   NEGATIVE       Bilirubin Urine NEGATIVE  NEGATIVE   NEGATIVE       Ketones, ur NEGATIVE mg/dL NEGATIVE   NEGATIVE       Protein, ur NEGATIVE mg/dL NEGATIVE   NEGATIVE       Urobilinogen, UA 0.0 - 1.0 mg/dL 1.0   0.2       Nitrite NEGATIVE  NEGATIVE   NEGATIVE       Leukocytes, UA NEGATIVE  NEGATIVE   NEGATIVE CM    Comments: MICROSCOPIC NOT DONE ON URINES WITH NEGATIVE PROTEIN, BLOOD, LEUKOCYTES, NITRITE, OR GLUCOSE <1000 mg/dL.    Resulting Agency  [facility]    Specimen Collected: 05/12/14 7:45 PM      RADIOGRAPHIC STUDIES: Chest W Contrast Status: Final result         PACS Images    Show images for CT Chest W Contrast         Study Result    CLINICAL DATA: Followup metastatic neuroendocrine carcinoma.  EXAM:  CT CHEST WITH CONTRAST  TECHNIQUE:  Multidetector CT imaging of the chest was performed during  intravenous contrast administration.  CONTRAST: 14m OMNIPAQUE IOHEXOL 300 MG/ML SOLN  COMPARISON: 05/27/2013  FINDINGS:  Mediastinum/[place]: No masses or pathologically enlarged  lymph nodes identified. Large hiatal hernia again noted.  Other Thoracic Lymphadenopathy: None.  Lungs: Numerous small bilateral pulmonary nodules appearing size are  again seen throughout both lungs, consistent with pulmonary  metastases. These show no significant change in number or size  compared to previous study. Dominant metastasis in the posterior  right lower lobe measures 15 mm on image 38, and dominant metastasis  in the left lower lobe measures 1.4 cm  on image 36.  Pleura: No evidence of effusion or mass.  Vascular/Cardiac: No thoracic aortic aneurysm or other significant  abnormality identified.  Musculoskeletal: No suspicious bone lesions identified.  Other: None.  IMPRESSION:  No significant change in and diffuse bilateral pulmonary metastases.  No new or progressive disease identified within the thorax.  Stable large hiatal hernia.  Electronically Signed  By: JEarle GellM.D.  On: 05/13/2014 14:26   CT Head Wo Contrast Status: Final result         PACS Images    Show images for CT Head Wo Contrast         Study Result    CLINICAL DATA: Dizziness.  EXAM:  CT HEAD WITHOUT CONTRAST  TECHNIQUE:  Contiguous axial images were obtained from the base of the skull  through the vertex without intravenous contrast.  COMPARISON: 07/25/2013  FINDINGS:  Prominence of the sulci in noted compatible with brain atrophy. Mild  low attenuation within the subcortical and periventricular white  matter suggests chronic microvascular disease. No evidence for acute  intracranial hemorrhage, infarct or mass. The paranasal sinuses and  mastoid air cells are clear. The skull is intact.  IMPRESSION:  1. No acute intracranial abnormalities.  2. Brain atrophy.  Electronically Signed  By: TKerby MoorsM.D.  On: 05/12/2014 18:36     ASSESSMENT/PLAN:    Malignant poorly differentiated neuroendocrine carcinoma  Assessment & Plan Patient is currently undergoing no active treatment.  Patient obtained any head CT yesterday afternoon; and obtained a restaging CT of the chest earlier today.  Head CT was negative for acute findings.  Chest CT reveals stable disease.  Patient has plans to return for labs and follow up visit with Dr. Benay Spice on 08/12/2014.   Fatigue  Assessment & Plan Patient continues to complain of chronic fatigue.  Patient admits that she is not a very on a daily basis.  Patient was encouraged to increase her daily  activity to a minimal 20 minutes at a time.   Benign positional vertigo  Assessment & Plan Patient has been diagnosed in the past with benign positional vertigo; which does continue to cause her a great deal of difficulty.  Head CT obtained just yesterday was negative for any acute findings.  Patient states she has been evaluated multiple times per ENT; and they have agreed with the diagnosis of benign positional vertigo.  Since patient continues to complain of chronic dizziness with associated nausea/vomiting and subsequent anorexia and occasional dehydration-will order a neurology consult for further evaluation.   Nausea with vomiting  Assessment & Plan The patient states that her dizziness does cause nausea and occasional vomiting.  She feels that she is dehydrated today.   She states she has antinausea medications are yet home.  She refused any Zofran here at the [place].   Dehydration  Assessment & Plan Patient received 1 L normal saline IV fluid rehydration while in the emergency department yesterday afternoon.  Patient requested additional IV fluid rehydration while at the [place].  Patient to receive 1 L normal saline IV fluid rehydration again today.   Anorexia  Assessment & Plan Patient states that her chronic dizziness causes her frequent nausea and vomiting.  She states that she has little appetite at this time.  Patient was in her to eat multiple small meals throughout the day if at all possible.   Anxiety  Assessment & Plan Patient does admit to some chronic anxiety and questionable depression.  She states that she is bored at home; and frequently thinks/worries about her cancer returning.  She has met with the [place] worker multiple times in the past; and states that she has plans to meet with the counselor again very soon.  She denies any suicidal or homicidal ideation.   Patient stated understanding of all instructions; and was in agreement  with this plan of care. The patient knows to call the clinic with any problems, questions or concerns.   Review/collaboration with Dr. Benay Spice  regarding all aspects of patient's visit today.   Total time spent with patient was  40  minutes;  with greater than  80  percent of that time spent in face to face counseling regarding  her symptoms, review of her labs and scan results, and coordination of care and follow up.  Disclaimer: This note was dictated with voice recognition software. Similar sounding words can inadvertently be transcribed and may not be corrected upon review.   Drue Second, NP 05/15/2014

## 2014-05-15 NOTE — Assessment & Plan Note (Signed)
Patient has been diagnosed in the past with benign positional vertigo; which does continue to cause her a great deal of difficulty.  Head CT obtained just yesterday was negative for any acute findings.  Patient states she has been evaluated multiple times per ENT; and they have agreed with the diagnosis of benign positional vertigo.  Since patient continues to complain of chronic dizziness with associated nausea/vomiting and subsequent anorexia and occasional dehydration-will order a neurology consult for further evaluation.

## 2014-05-15 NOTE — Telephone Encounter (Signed)
Patient left VM stating she has some questions for Selena Lesser, NP. I called patient back and she asked if the nausea and dizziness could be coming from her cancer. Spoke with Cyndee and she saw patient yesterday and states she reviewed in detail with patient that both head and chest CT were negative and that she has benign positional vertigo. Called patient back and she states she does remember Cyndee saying this. Told her that Cyndee placed a neurology consult and that someone from neurology should be calling her soon to schedule this.

## 2014-05-15 NOTE — Assessment & Plan Note (Signed)
Patient does admit to some chronic anxiety and questionable depression.  She states that she is bored at home; and frequently thinks/worries about her cancer returning.  She has met with the [place] worker multiple times in the past; and states that she has plans to meet with the counselor again very soon.  She denies any suicidal or homicidal ideation.

## 2014-05-15 NOTE — Assessment & Plan Note (Signed)
Patient is currently undergoing no active treatment.  Patient obtained any head CT yesterday afternoon; and obtained a restaging CT of the chest earlier today.  Head CT was negative for acute findings.  Chest CT reveals stable disease.  Patient has plans to return for labs and follow up visit with Dr. Benay Spice on 08/12/2014.

## 2014-05-15 NOTE — Assessment & Plan Note (Signed)
Patient continues to complain of chronic fatigue.  Patient admits that she is not a very on a daily basis.  Patient was encouraged to increase her daily activity to a minimal 20 minutes at a time.

## 2014-05-15 NOTE — Telephone Encounter (Signed)
gv referral to Blima Singer to fax to [facility]

## 2014-05-15 NOTE — Assessment & Plan Note (Signed)
Patient states that her chronic dizziness causes her frequent nausea and vomiting.  She states that she has little appetite at this time.  Patient was in her to eat multiple small meals throughout the day if at all possible.

## 2014-05-15 NOTE — Assessment & Plan Note (Addendum)
The patient states that her dizziness does cause nausea and occasional vomiting.  She feels that she is dehydrated today.   She states she has antinausea medications are yet home.  She refused any Zofran here at the [place].

## 2014-05-15 NOTE — Assessment & Plan Note (Signed)
Patient received 1 L normal saline IV fluid rehydration while in the emergency department yesterday afternoon.  Patient requested additional IV fluid rehydration while at the [place].  Patient to receive 1 L normal saline IV fluid rehydration again today.

## 2014-05-19 ENCOUNTER — Telehealth: Payer: Self-pay | Admitting: Oncology

## 2014-05-19 NOTE — Telephone Encounter (Signed)
Faxed pt medical records to [place]

## 2014-05-20 ENCOUNTER — Ambulatory Visit: Payer: Medicare Other | Admitting: Oncology

## 2014-05-21 NOTE — Progress Notes (Signed)
Counseling intern called patient to check in. Patient sounded discouraged over the phone, and indicated she is still suffering from anxiety and panic attacks. Counselor encouraged patient to talk with doctor about adjusting anxiety medication, and to practice breathing exercises and relaxation techniques. Counselor will call patient again within the week to check in.   [facility] Counseling Intern [phone]

## 2014-05-22 ENCOUNTER — Telehealth: Payer: Self-pay | Admitting: Oncology

## 2014-05-22 NOTE — Telephone Encounter (Signed)
Faxed pt medical records to  [id number] [facility]

## 2014-06-02 ENCOUNTER — Other Ambulatory Visit: Payer: Self-pay | Admitting: Gastroenterology

## 2014-06-02 DIAGNOSIS — R1084 Generalized abdominal pain: Secondary | ICD-10-CM

## 2014-06-02 NOTE — Progress Notes (Signed)
Counseling intern talked with patient over the phone. Patient sounds extremely discouraged and powerless to deal with ongoing vertigo, depression, anxiety, and panic attacks. Patient requested a referral for a doctor who can prescribe medication to help her cope. Counselor is working to find a suitable referral. Patient reported loss of appetite, and trouble sleeping.  [facility] Counseling Intern [phone]

## 2014-06-03 ENCOUNTER — Institutional Professional Consult (permissible substitution): Payer: Self-pay | Admitting: Neurology

## 2014-06-04 ENCOUNTER — Ambulatory Visit
Admission: RE | Admit: 2014-06-04 | Discharge: 2014-06-04 | Disposition: A | Discharge status: Home / Self Care | Payer: Medicare Other | Source: Ambulatory Visit | Attending: Gastroenterology | Admitting: Gastroenterology

## 2014-06-04 DIAGNOSIS — R1084 Generalized abdominal pain: Secondary | ICD-10-CM

## 2014-06-04 MED ORDER — IOHEXOL 300 MG/ML  SOLN
100.0000 mL | Freq: Once | INTRAMUSCULAR | Status: AC | PRN
  Administered 2014-06-04: 100 mL via INTRAVENOUS

## 2014-06-10 ENCOUNTER — Other Ambulatory Visit: Payer: Self-pay | Admitting: Nurse Practitioner

## 2014-06-13 ENCOUNTER — Ambulatory Visit
Admission: RE | Admit: 2014-06-13 | Discharge: 2014-06-13 | Disposition: A | Discharge status: Home / Self Care | Payer: Medicare Other | Source: Ambulatory Visit | Attending: Gastroenterology | Admitting: Gastroenterology

## 2014-06-13 DIAGNOSIS — R1084 Generalized abdominal pain: Secondary | ICD-10-CM

## 2014-06-22 ENCOUNTER — Emergency Department (HOSPITAL_COMMUNITY): Payer: Medicare Other

## 2014-06-22 ENCOUNTER — Encounter (HOSPITAL_COMMUNITY): Payer: Self-pay | Admitting: Oncology

## 2014-06-22 ENCOUNTER — Emergency Department (HOSPITAL_COMMUNITY)
Admission: EM | Admit: 2014-06-22 | Discharge: 2014-06-23 | Disposition: A | Discharge status: Home / Self Care | Payer: Medicare Other | Attending: Emergency Medicine | Admitting: Emergency Medicine

## 2014-06-22 DIAGNOSIS — R110 Nausea: Secondary | ICD-10-CM | POA: Diagnosis present

## 2014-06-22 DIAGNOSIS — Z85118 Personal history of other malignant neoplasm of bronchus and lung: Secondary | ICD-10-CM | POA: Diagnosis not present

## 2014-06-22 DIAGNOSIS — I10 Essential (primary) hypertension: Secondary | ICD-10-CM | POA: Diagnosis not present

## 2014-06-22 DIAGNOSIS — Z8659 Personal history of other mental and behavioral disorders: Secondary | ICD-10-CM | POA: Insufficient documentation

## 2014-06-22 DIAGNOSIS — K219 Gastro-esophageal reflux disease without esophagitis: Secondary | ICD-10-CM | POA: Insufficient documentation

## 2014-06-22 DIAGNOSIS — Z79899 Other long term (current) drug therapy: Secondary | ICD-10-CM | POA: Diagnosis not present

## 2014-06-22 DIAGNOSIS — Z7951 Long term (current) use of inhaled steroids: Secondary | ICD-10-CM | POA: Diagnosis not present

## 2014-06-22 DIAGNOSIS — Z8669 Personal history of other diseases of the nervous system and sense organs: Secondary | ICD-10-CM | POA: Diagnosis not present

## 2014-06-22 DIAGNOSIS — D509 Iron deficiency anemia, unspecified: Secondary | ICD-10-CM | POA: Insufficient documentation

## 2014-06-22 DIAGNOSIS — R42 Dizziness and giddiness: Secondary | ICD-10-CM | POA: Diagnosis not present

## 2014-06-22 HISTORY — DX: Panic disorder (episodic paroxysmal anxiety): F41.0

## 2014-06-22 HISTORY — DX: Anxiety disorder, unspecified: F41.9

## 2014-06-22 LAB — CBC
HCT: 37.3 % (ref 36.0–46.0)
Hemoglobin: 11.3 g/dL — ABNORMAL LOW (ref 12.0–15.0)
MCH: 22.9 pg — ABNORMAL LOW (ref 26.0–34.0)
MCHC: 30.3 g/dL (ref 30.0–36.0)
MCV: 75.7 fL — ABNORMAL LOW (ref 78.0–100.0)
PLATELETS: 384 10*3/uL (ref 150–400)
RBC: 4.93 MIL/uL (ref 3.87–5.11)
RDW: 16.1 % — ABNORMAL HIGH (ref 11.5–15.5)
WBC: 12.2 10*3/uL — AB (ref 4.0–10.5)

## 2014-06-22 LAB — COMPREHENSIVE METABOLIC PANEL
ALT: 10 U/L (ref 0–35)
AST: 12 U/L (ref 0–37)
Albumin: 3.6 g/dL (ref 3.5–5.2)
Alkaline Phosphatase: 77 U/L (ref 39–117)
Anion gap: 14 (ref 5–15)
BILIRUBIN TOTAL: 0.4 mg/dL (ref 0.3–1.2)
BUN: 7 mg/dL (ref 6–23)
CALCIUM: 9.6 mg/dL (ref 8.4–10.5)
CHLORIDE: 102 meq/L (ref 96–112)
CO2: 26 meq/L (ref 19–32)
Creatinine, Ser: 1.06 mg/dL (ref 0.50–1.10)
GFR calc Af Amer: 63 mL/min — ABNORMAL LOW (ref >90)
GFR, EST NON AFRICAN AMERICAN: 54 mL/min — AB (ref >90)
Glucose, Bld: 99 mg/dL (ref 70–99)
Potassium: 3.8 mEq/L (ref 3.7–5.3)
SODIUM: 142 meq/L (ref 137–147)
Total Protein: 8.4 g/dL — ABNORMAL HIGH (ref 6.0–8.3)

## 2014-06-22 LAB — I-STAT CG4 LACTIC ACID, ED: LACTIC ACID, VENOUS: 0.92 mmol/L (ref 0.5–2.2)

## 2014-06-22 LAB — URINALYSIS, ROUTINE W REFLEX MICROSCOPIC
Bilirubin Urine: NEGATIVE
Glucose, UA: NEGATIVE mg/dL
Hgb urine dipstick: NEGATIVE
KETONES UR: NEGATIVE mg/dL
LEUKOCYTES UA: NEGATIVE
NITRITE: NEGATIVE
PH: 7 (ref 5.0–8.0)
Protein, ur: NEGATIVE mg/dL
Specific Gravity, Urine: 1.011 (ref 1.005–1.030)
Urobilinogen, UA: 0.2 mg/dL (ref 0.0–1.0)

## 2014-06-22 LAB — I-STAT TROPONIN, ED: Troponin i, poc: 0 ng/mL (ref 0.00–0.08)

## 2014-06-22 LAB — PRO B NATRIURETIC PEPTIDE: PRO B NATRI PEPTIDE: 256 pg/mL — AB (ref 0–125)

## 2014-06-22 MED ORDER — IOHEXOL 350 MG/ML SOLN: 100.0000 mL | Freq: Once | INTRAVENOUS | Status: AC | PRN

## 2014-06-22 MED ORDER — ONDANSETRON 4 MG PO TBDP: ORAL_TABLET | ORAL | Status: DC

## 2014-06-22 MED ORDER — LORAZEPAM 1 MG PO TABS: 1.0000 mg | ORAL_TABLET | Freq: Three times a day (TID) | ORAL | Status: DC | PRN

## 2014-06-22 MED ORDER — SODIUM CHLORIDE 0.9 % IV BOLUS (SEPSIS)
1000.0000 mL | Freq: Once | INTRAVENOUS | Status: AC
  Administered 2014-06-22: 1000 mL via INTRAVENOUS

## 2014-06-22 MED ORDER — ONDANSETRON HCL 4 MG/2ML IJ SOLN
4.0000 mg | Freq: Once | INTRAMUSCULAR | Status: AC
  Administered 2014-06-22: 4 mg via INTRAVENOUS
  Filled 2014-06-22: qty 2

## 2014-06-22 MED ORDER — LORAZEPAM 1 MG PO TABS
1.0000 mg | ORAL_TABLET | Freq: Once | ORAL | Status: AC
  Administered 2014-06-22: 1 mg via ORAL
  Filled 2014-06-22: qty 1

## 2014-06-22 MED ORDER — MECLIZINE HCL 25 MG PO TABS
25.0000 mg | ORAL_TABLET | Freq: Once | ORAL | Status: DC
  Filled 2014-06-22: qty 1

## 2014-06-22 NOTE — ED Provider Notes (Signed)
CSN: [id number]     Arrival date & time 06/22/14  1938 History   First MD Initiated Contact with Patient 06/22/14 2000     Chief Complaint  Patient presents with  . Nausea     (Consider location/radiation/quality/duration/timing/severity/associated sxs/prior Treatment) Patient is a 63 y.o. female presenting with dizziness. The history is provided by the patient.  Dizziness Quality:  Head spinning and lightheadedness Severity:  Moderate Onset quality:  Gradual Timing:  Constant Progression:  Unchanged Chronicity:  Recurrent Context comment:  Spontaneously Relieved by:  Nothing Worsened by:  Nothing tried Associated symptoms: no shortness of breath   Risk factors: hx of vertigo     Past Medical History  Diagnosis Date  . lung ca     lung ca  . Iron deficiency anemia   . Hypertension   . GERD (gastroesophageal reflux disease)   . Hiatal hernia   . Positional vertigo   . Anxiety   . Panic attacks    Past Surgical History  Procedure Laterality Date  . Abdominal hysterectomy  1995  . Cholecystectomy  2005  . Appendectomy  2005  . Partial colectomy  2005   Family History  Problem Relation Age of Onset  . Congestive Heart Failure Father   . Congestive Heart Failure Other     paternal aunts/uncles  . Clotting disorder Mother   . Stroke Mother    History  Substance Use Topics  . Smoking status: Never Smoker   . Smokeless tobacco: Not on file  . Alcohol Use: No   OB History    No data available     Review of Systems  Constitutional: Negative for fever.  Respiratory: Negative for cough and shortness of breath.   Neurological: Positive for dizziness.  All other systems reviewed and are negative.     Allergies  Pineapple  Home Medications   Prior to Admission medications   Medication Sig Start Date End Date Taking? Authorizing Provider  BYSTOLIC 10 MG tablet Take 10 mg by mouth daily. 08/31/12  Yes Historical Provider, MD  estrogens, conjugated,  (PREMARIN) 0.3 MG tablet Take 0.3 mg by mouth daily.    Yes Historical Provider, MD  ferrous sulfate 325 (65 FE) MG tablet Take 325 mg by mouth daily with breakfast.   Yes Historical Provider, MD  fluticasone (FLONASE) 50 MCG/ACT nasal spray Place 2 sprays into both nostrils daily as needed for allergies or rhinitis (rhinitis).    Yes Historical Provider, MD  hydrOXYzine (VISTARIL) 25 MG capsule Take 25 mg by mouth 2 (two) times daily.   Yes Historical Provider, MD  LORazepam (ATIVAN) 1 MG tablet Take 1 mg by mouth daily as needed for anxiety (anxiety).    Yes Historical Provider, MD  meclizine (ANTIVERT) 25 MG tablet Take 25 mg by mouth 3 (three) times daily as needed for dizziness (dizziness). For dizziness   Yes Historical Provider, MD  omeprazole (PRILOSEC) 20 MG capsule Take 20 mg by mouth daily.   Yes Historical Provider, MD  mirtazapine (REMERON) 15 MG tablet TAKE 1 TABLET (15 MG TOTAL) BY MOUTH AT BEDTIME. 06/10/14   Drue Second, NP  mirtazapine (REMERON) 15 MG tablet Take 15 mg by mouth at bedtime.    Historical Provider, MD  ondansetron (ZOFRAN) 4 MG tablet Take 1 tablet (4 mg total) by mouth every 6 (six) hours. 03/05/14   Dorie Rank, MD  predniSONE (DELTASONE) 10 MG tablet Take  4 each am x 2 days,   2 each am x  2 days,  1 each am x 2 days and stop 04/01/14   Tanda Rockers, MD   BP 128/45 mmHg  Pulse 70  Temp(Src) 97.9 F (36.6 C) (Oral)  Resp 18  SpO2 96% Physical Exam  Constitutional: She is oriented to person, place, and time. She appears well-developed and well-nourished. No distress.  HENT:  Head: Normocephalic and atraumatic.  Mouth/Throat: Oropharynx is clear and moist. No oropharyngeal exudate.  Eyes: EOM are normal. Pupils are equal, round, and reactive to light.  Neck: Normal range of motion. Neck supple.  Cardiovascular: Normal rate and regular rhythm.  Exam reveals no friction rub.   No murmur heard. Pulmonary/Chest: Effort normal and breath sounds normal. No  respiratory distress. She has no wheezes. She has no rales.  Abdominal: Soft. She exhibits no distension. There is no tenderness. There is no rebound.  Musculoskeletal: Normal range of motion. She exhibits no edema.  Neurological: She is alert and oriented to person, place, and time. No cranial nerve deficit. She exhibits normal muscle tone. Coordination normal.  Skin: No rash noted. She is not diaphoretic.  Nursing note and vitals reviewed.   ED Course  Procedures (including critical care time) Labs Review Labs Reviewed  CBC  COMPREHENSIVE METABOLIC PANEL  URINALYSIS, ROUTINE W REFLEX MICROSCOPIC  PRO B NATRIURETIC PEPTIDE  I-STAT CG4 LACTIC ACID, ED  Randolm Idol, ED    Imaging Review Dg Chest 2 View  06/22/2014   CLINICAL DATA:  Chronic dizziness. No chest complaints. Controlled hypertension. Nonsmoker.  EXAM: CHEST  2 VIEW  COMPARISON:  03/05/2014  FINDINGS: Cardiac silhouette is normal in size. Moderate hiatal hernia is stable. Aorta is mildly uncoiled. No hilar masses or adenopathy.  Stable benign left lower lobe nodule. Mild stable linear scarring or chronic atelectasis in the right lung base. No lung consolidation or edema. No pleural effusion or pneumothorax.  Bony thorax is demineralized but intact.  IMPRESSION: No acute cardiopulmonary disease.   Electronically Signed   By: Lajean Manes M.D.   On: 06/22/2014 21:11     EKG Interpretation None      MDM   Final diagnoses:  Dizziness    63 year old female here with dizziness and weakness. Has had this for about 6 weeks now. Was seen in late September for this, had normal workup at that time. Note and from Dr. Doy Mince and Lucien Mons described similar episode to what is going on today. She did improve with fluids anti-emetics at that time. Here today describing nausea. She was recently switched from Ativan and hydroxyzine for anxiety without relief. Denies any chest pain, shortness of breath. She does report some  dizziness when she is getting up and moving around. This described as room spinning and lightheaded. She had some near syncope today. No history of syncope with this.  Work up. I offered CT PE study to look for possible blood clot but she declined. Patient wants to be admitted for iron transfusion, however after speaking with Dr. Arnoldo Morale with the hospitalist service, we don't offer inpatient iron transfusions. Patient will f/u with her oncologist tomorrow. I sent Dr. Benay Spice a message through Epic discussing this.  Evelina Bucy, MD 06/22/14 [phone]

## 2014-06-22 NOTE — ED Notes (Signed)
Bed: YE33 Expected date:  Expected time:  Means of arrival:  Comments: EMS/vertigo and weakness

## 2014-06-22 NOTE — ED Notes (Signed)
Patient transported to X-ray 

## 2014-06-22 NOTE — Discharge Instructions (Signed)

## 2014-06-22 NOTE — ED Notes (Signed)
Pt took hydroxyzine this am and immediately did not feel well.  Per EMS pt c/o nausea/vomiting, increased dizziness(pt has hx of vertigo) and weakness.  Pt is A&O x 4.  Pt is concerned she is having an allergic reaction to hydroxyzine however no hives, redness, swelling, SOB or feeling as if her tongue is swelling.

## 2014-06-24 ENCOUNTER — Telehealth: Payer: Self-pay | Admitting: *Deleted

## 2014-06-24 DIAGNOSIS — D649 Anemia, unspecified: Secondary | ICD-10-CM

## 2014-06-24 NOTE — Telephone Encounter (Signed)
-----   Message from Ladell Pier, MD sent at 06/23/2014  7:42 PM EST ----- Schedule office sherrill or lisa next 2 weeks with cbc/ferritin ----- Message -----    From: Evelina Bucy, MD    Sent: 06/22/2014  11:42 PM      To: Ladell Pier, MD  Dr. Benay Spice,  I saw Ms. Mecum today for dizziness, has long standing history of this. She reports she has gotten iron infusions for this previously. No anemia here today. I asked our hospitalists about this and they recommended follow up with her PCP and Oncology. Patient stated her PCP was hesitant to initiate any iron transfusions.  I asked her to follow up with you for this. Thanks for your help.  -Evelina Bucy

## 2014-06-25 NOTE — Telephone Encounter (Signed)
Orders entered for lab/office in two weeks. Schedulers to call pt.

## 2014-06-26 ENCOUNTER — Telehealth: Payer: Self-pay | Admitting: Oncology

## 2014-06-26 NOTE — Telephone Encounter (Signed)
Lft msg for pt confirming labs/ov per 11/11 POF, mailed out sch to pt ....Marland KitchenMarland KitchenMarland Kitchen KJ

## 2014-06-30 ENCOUNTER — Emergency Department (EMERGENCY_DEPARTMENT_HOSPITAL)
Admission: EM | Admit: 2014-06-30 | Discharge: 2014-07-02 | Disposition: A | Discharge status: Other Acute Inpatient Hospital | Payer: Medicare Other | Source: Home / Self Care | Attending: Emergency Medicine | Admitting: Emergency Medicine

## 2014-06-30 ENCOUNTER — Encounter (HOSPITAL_COMMUNITY): Payer: Self-pay | Admitting: Emergency Medicine

## 2014-06-30 DIAGNOSIS — K219 Gastro-esophageal reflux disease without esophagitis: Secondary | ICD-10-CM | POA: Insufficient documentation

## 2014-06-30 DIAGNOSIS — F419 Anxiety disorder, unspecified: Secondary | ICD-10-CM | POA: Insufficient documentation

## 2014-06-30 DIAGNOSIS — F4321 Adjustment disorder with depressed mood: Secondary | ICD-10-CM | POA: Diagnosis present

## 2014-06-30 DIAGNOSIS — C3490 Malignant neoplasm of unspecified part of unspecified bronchus or lung: Secondary | ICD-10-CM

## 2014-06-30 DIAGNOSIS — F329 Major depressive disorder, single episode, unspecified: Secondary | ICD-10-CM

## 2014-06-30 DIAGNOSIS — F32A Depression, unspecified: Secondary | ICD-10-CM

## 2014-06-30 DIAGNOSIS — Z862 Personal history of diseases of the blood and blood-forming organs and certain disorders involving the immune mechanism: Secondary | ICD-10-CM

## 2014-06-30 DIAGNOSIS — I10 Essential (primary) hypertension: Secondary | ICD-10-CM

## 2014-06-30 DIAGNOSIS — R45851 Suicidal ideations: Secondary | ICD-10-CM

## 2014-06-30 DIAGNOSIS — C7A1 Malignant poorly differentiated neuroendocrine tumors: Secondary | ICD-10-CM

## 2014-06-30 DIAGNOSIS — Z79899 Other long term (current) drug therapy: Secondary | ICD-10-CM | POA: Insufficient documentation

## 2014-06-30 DIAGNOSIS — F322 Major depressive disorder, single episode, severe without psychotic features: Secondary | ICD-10-CM | POA: Diagnosis not present

## 2014-06-30 LAB — COMPREHENSIVE METABOLIC PANEL
ALBUMIN: 0.2 g/dL — AB (ref 3.5–5.2)
ALK PHOS: 5 U/L — AB (ref 39–117)
ALT: 9 U/L (ref 0–35)
AST: 13 U/L (ref 0–37)
Anion gap: 15 (ref 5–15)
BUN: 8 mg/dL (ref 6–23)
CALCIUM: 9.8 mg/dL (ref 8.4–10.5)
CO2: 25 mEq/L (ref 19–32)
Chloride: 101 mEq/L (ref 96–112)
Creatinine, Ser: 0.95 mg/dL (ref 0.50–1.10)
GFR calc Af Amer: 72 mL/min — ABNORMAL LOW (ref >90)
GFR calc non Af Amer: 62 mL/min — ABNORMAL LOW (ref >90)
Glucose, Bld: 82 mg/dL (ref 70–99)
POTASSIUM: 4 meq/L (ref 3.7–5.3)
SODIUM: 141 meq/L (ref 137–147)
TOTAL PROTEIN: 8.6 g/dL — AB (ref 6.0–8.3)
Total Bilirubin: 0.4 mg/dL (ref 0.3–1.2)

## 2014-06-30 LAB — RAPID URINE DRUG SCREEN, HOSP PERFORMED
AMPHETAMINES: NOT DETECTED
BENZODIAZEPINES: NOT DETECTED
Barbiturates: NOT DETECTED
COCAINE: NOT DETECTED
OPIATES: NOT DETECTED
TETRAHYDROCANNABINOL: NOT DETECTED

## 2014-06-30 LAB — CBC
HCT: 36.1 % (ref 36.0–46.0)
Hemoglobin: 10.7 g/dL — ABNORMAL LOW (ref 12.0–15.0)
MCH: 22.4 pg — AB (ref 26.0–34.0)
MCHC: 29.6 g/dL — AB (ref 30.0–36.0)
MCV: 75.5 fL — ABNORMAL LOW (ref 78.0–100.0)
PLATELETS: 433 10*3/uL — AB (ref 150–400)
RBC: 4.78 MIL/uL (ref 3.87–5.11)
RDW: 16.4 % — AB (ref 11.5–15.5)
WBC: 10.4 10*3/uL (ref 4.0–10.5)

## 2014-06-30 LAB — ACETAMINOPHEN LEVEL: Acetaminophen (Tylenol), Serum: <15 ug/mL (ref 10–30)

## 2014-06-30 LAB — SALICYLATE LEVEL: Salicylate Lvl: <2 mg/dL — ABNORMAL LOW (ref 2.8–20.0)

## 2014-06-30 LAB — ETHANOL: Alcohol, Ethyl (B): <11 mg/dL (ref 0–11)

## 2014-06-30 MED ORDER — ALUM & MAG HYDROXIDE-SIMETH 200-200-20 MG/5ML PO SUSP: 30.0000 mL | ORAL | Status: DC | PRN

## 2014-06-30 MED ORDER — FLUTICASONE PROPIONATE 50 MCG/ACT NA SUSP: 2.0000 | Freq: Every day | NASAL | Status: DC | PRN

## 2014-06-30 MED ORDER — ONDANSETRON HCL 4 MG PO TABS: 4.0000 mg | ORAL_TABLET | Freq: Three times a day (TID) | ORAL | Status: DC | PRN

## 2014-06-30 MED ORDER — ONDANSETRON HCL 4 MG PO TABS
4.0000 mg | ORAL_TABLET | Freq: Four times a day (QID) | ORAL | Status: DC
  Administered 2014-07-01: 4 mg via ORAL
  Filled 2014-06-30 (×3): qty 1

## 2014-06-30 MED ORDER — ESTROGENS CONJUGATED 0.3 MG PO TABS
0.3000 mg | ORAL_TABLET | Freq: Every evening | ORAL | Status: DC
  Administered 2014-06-30 – 2014-07-02 (×3): 0.3 mg via ORAL
  Filled 2014-06-30 (×3): qty 1

## 2014-06-30 MED ORDER — ACETAMINOPHEN 325 MG PO TABS
650.0000 mg | ORAL_TABLET | ORAL | Status: DC | PRN
Start: 2014-06-30 — End: 2014-07-02

## 2014-06-30 MED ORDER — MIRTAZAPINE 30 MG PO TABS
15.0000 mg | ORAL_TABLET | Freq: Every day | ORAL | Status: DC
  Administered 2014-06-30 – 2014-07-01 (×2): 15 mg via ORAL
  Filled 2014-06-30 (×2): qty 1

## 2014-06-30 MED ORDER — PANTOPRAZOLE SODIUM 40 MG PO TBEC
40.0000 mg | DELAYED_RELEASE_TABLET | Freq: Every day | ORAL | Status: DC
  Administered 2014-07-01 – 2014-07-02 (×2): 40 mg via ORAL
  Filled 2014-06-30 (×2): qty 1

## 2014-06-30 MED ORDER — NICOTINE 21 MG/24HR TD PT24: 21.0000 mg | MEDICATED_PATCH | Freq: Every day | TRANSDERMAL | Status: DC

## 2014-06-30 MED ORDER — LORAZEPAM 1 MG PO TABS
1.0000 mg | ORAL_TABLET | Freq: Three times a day (TID) | ORAL | Status: DC | PRN
  Filled 2014-06-30: qty 1

## 2014-06-30 MED ORDER — FERROUS SULFATE 325 (65 FE) MG PO TABS
325.0000 mg | ORAL_TABLET | Freq: Every day | ORAL | Status: DC
  Administered 2014-07-01 – 2014-07-02 (×2): 325 mg via ORAL
  Filled 2014-06-30 (×3): qty 1

## 2014-06-30 MED ORDER — LORAZEPAM 1 MG PO TABS
1.0000 mg | ORAL_TABLET | Freq: Three times a day (TID) | ORAL | Status: DC | PRN
  Administered 2014-06-30: 1 mg via ORAL

## 2014-06-30 MED ORDER — IBUPROFEN 200 MG PO TABS: 600.0000 mg | ORAL_TABLET | Freq: Three times a day (TID) | ORAL | Status: DC | PRN

## 2014-06-30 MED ORDER — MECLIZINE HCL 25 MG PO TABS: 25.0000 mg | ORAL_TABLET | Freq: Three times a day (TID) | ORAL | Status: DC | PRN

## 2014-06-30 MED ORDER — NEBIVOLOL HCL 10 MG PO TABS
10.0000 mg | ORAL_TABLET | Freq: Every day | ORAL | Status: DC
  Administered 2014-07-01: 10 mg via ORAL
  Filled 2014-06-30 (×2): qty 1

## 2014-06-30 MED ORDER — ZOLPIDEM TARTRATE 5 MG PO TABS: 5.0000 mg | ORAL_TABLET | Freq: Every evening | ORAL | Status: DC | PRN

## 2014-06-30 NOTE — BH Assessment (Addendum)
Assessment Note  Ashley Farmer is an 63 y.o. female. Pt presents voluntarily to Ashley Farmer her nephew. Pt reports increased anxiety and depressive symptoms over the past 1.5 mos. Pt endorses twice daily panic attacks with most recent attack 06/29/14. Pt endorses tearfulness, loss of interest in usual pleasures, isolating bx, worthlessness, insomnia and poor appetite. Pt currently denies SI. She sts that she sometimes thinks it would be easier on her family "if she wasn't around" but pt adamantly denies suicidal intent or plan. She denies HI and denies [facility]. No delusions noted. Pt reports having lung CA for past 5 yrs. She reports strong support system and lives w/ her sister and sister's family. Pt says pt's son is also very supportive. Pt's affect is depressed and she is tearful. Pt denies hx of inpt MH treatment. She says she has seen a counselor recently, Ashley Farmer, but pt sts she hasn't seen Ashley Farmer recently d/t vertigo. Pt says, "I'm not in control of things. I have a fear of being alone." Pt sts she stays alone during day while her relatives work and go to school.  Ashley Bucker NP recommends outpatient treatment.  Addendum - Pt now unable to contract for safety. Pt sobbing and stating she "cant do this anymore". 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 lung ca     lung ca  . Iron deficiency anemia   . Hypertension   . GERD (gastroesophageal reflux disease)   . Hiatal hernia   . Positional vertigo   . Anxiety   . Panic attacks    Axis IV: other psychosocial or environmental problems and problems related to social environment Axis V: 51-60 moderate symptoms  Past Medical History:  Past Medical History  Diagnosis Date  . lung ca      lung ca  . Iron deficiency anemia   . Hypertension   . GERD (gastroesophageal reflux disease)   . Hiatal hernia   . Positional vertigo   . Anxiety   . Panic attacks     Past Surgical History  Procedure Laterality Date  . Abdominal hysterectomy  1995  . Cholecystectomy  2005  . Appendectomy  2005  . Partial colectomy  2005    Family History:  Family History  Problem Relation Age of Onset  . Congestive Heart Failure Father   . Congestive Heart Failure Other     paternal aunts/uncles  . Clotting disorder Mother   . Stroke Mother     Social History:  reports that she has never smoked. She does not have any smokeless tobacco history on file. She reports that she does not drink alcohol or use illicit drugs.  Additional Social History:  Alcohol / Drug Use Pain Medications: see PTA meds list - pt denies abuse Prescriptions: see PTA meds list - pt denies abuse Over the Counter: see PTA meds list - pt denies abuse History of alcohol / drug use?: No history of alcohol / drug abuse  CIWA: CIWA-Ar BP: 156/68 mmHg Pulse Rate: 88 COWS:    Allergies:  Allergies  Allergen Reactions  . Atarax [Hydroxyzine] Itching, Nausea And Vomiting and Other (See Comments)    Couldn't sleep, dizziness   .  Pineapple Rash    Home Medications:  (Not in a [facility] admission)  OB/GYN Status:  No LMP recorded. Patient has had a hysterectomy.  General Assessment Data Location of Assessment: [facility] Is this a Tele or Face-to-Face Assessment?: Face-to-Face Is this an Initial Assessment or a Re-assessment for this encounter?: Initial Assessment Living Arrangements: Other relatives (sister and sister's family) Can pt return to current living arrangement?: Yes Admission Status: Voluntary Is patient capable of signing voluntary admission?: Yes Transfer from: Home Referral Source: Other (BIB nephew per pt's request)     Ashley Farmer Living Arrangements: Other relatives (sister and  sister's family) Name of Psychiatrist: none Name of Therapist: Elson [facility] (pt sts has seen her a couple of times but not in a few weeks)  Education Status Is patient currently in school?: No Highest grade of school patient has completed: 78 Name of school: in [place]  Risk to self with the past 6 months Suicidal Ideation: Yes-Currently Present Suicidal Intent: No Is patient at risk for suicide?: No Suicidal Plan?: No Access to Means: No What has been your use of drugs/alcohol within the last 12 months?: pt denies substance use Previous Attempts/Gestures: No How many times?: 0 Other Self Harm Risks: none Triggers for Past Attempts:  (n/a) Intentional Self Injurious Behavior: None Family Suicide History: Yes (pt's sister tried to commit suicide after a stroke ) Recent stressful life event(s): Other (Comment), Recent negative physical changes (increased anxiety and depression over past two mos) Persecutory voices/beliefs?: No Depression: Yes Depression Symptoms: Fatigue, Isolating, Tearfulness, Insomnia, Loss of interest in usual pleasures, Feeling worthless/self pity Substance abuse history and/or treatment for substance abuse?: No Suicide prevention information given to non-admitted patients: Not applicable  Risk to Others within the past 6 months Homicidal Ideation: No Thoughts of Harm to Others: No Current Homicidal Intent: No Current Homicidal Plan: No Access to Homicidal Means: No Identified Victim: none History of harm to others?: No Assessment of Violence: None Noted Violent Behavior Description: pt denies hx violence Does patient have access to weapons?: No Criminal Charges Pending?: No Does patient have a court date: No  Psychosis Hallucinations: None noted Delusions: None noted  Mental Status Report Appear/Hygiene: In scrubs, Unremarkable Eye Contact: Good Motor Activity: Freedom of movement Speech: Logical/coherent, Soft Level of Consciousness:  Alert, Quiet/awake Mood: Depressed, Anxious, Anhedonia, Despair, Sad Affect: Appropriate to circumstance, Depressed, Sad Anxiety Level: Panic Attacks Panic attack frequency: twice daily Most recent panic attack: 06/29/14 Thought Processes: Relevant, Coherent Judgement: Unimpaired Orientation: Person, Place, Situation, Time Obsessive Compulsive Thoughts/Behaviors: None  Cognitive Functioning Concentration: Normal Memory: Recent Intact, Remote Intact IQ: Average Insight: Good Impulse Control: Good Appetite: Poor Weight Loss:  (unknown weight loss) Sleep: Decreased Total Hours of Sleep: 5 (over 24 hr period) Vegetative Symptoms: None  ADLScreening [facility] Assessment Services) Patient's cognitive ability adequate to safely complete daily activities?: Yes Patient able to express need for assistance with ADLs?: Yes Independently performs ADLs?: Yes (appropriate for developmental age)  Prior Inpatient Therapy Prior Inpatient Therapy: No Prior Therapy Dates: na Prior Therapy Facilty/Provider(s): na Reason for Treatment: na  Prior Outpatient Therapy Prior Outpatient Therapy: Yes Prior Therapy Dates: recently Prior Therapy Facilty/Provider(s): [facility] [place] Reason for Treatment: depression/anxiety  ADL Screening (condition at time of admission) Patient's cognitive ability adequate to safely complete daily activities?: Yes Is the patient deaf or have difficulty hearing?: No Does the patient have difficulty seeing, even when wearing glasses/contacts?: No Does the patient have difficulty concentrating, remembering, or  making decisions?: No Patient able to express need for assistance with ADLs?: Yes Does the patient have difficulty dressing or bathing?: No Independently performs ADLs?: Yes (appropriate for developmental age) Does the patient have difficulty walking or climbing stairs?: No Weakness of Legs: None Weakness of Arms/Hands: None  Home Assistive Devices/Equipment Home  Assistive Devices/Equipment:  (reading glasses)    Abuse/Neglect Assessment (Assessment to be complete while patient is alone) Physical Abuse: Denies Verbal Abuse: Denies Sexual Abuse: Denies Exploitation of patient/patient's resources: Denies Self-Neglect: Denies     Regulatory affairs officer (For Healthcare) Does patient have an advance directive?: No Would patient like information on creating an advanced directive?: No - patient declined information    Additional Information 1:1 In Past 12 Months?: No CIRT Risk: No Elopement Risk: No Does patient have medical clearance?: Yes     Disposition:  - Pt now unable to contract for safety. Pt sobbing and stating she "cant do this anymore". Writer spoke w/ Ashley Massed NP who now recommends pt to be kept overnight in the ED and reassessed by psychiatry am of 07/01/14   On Site Evaluation by:   Reviewed with Physician:    Leron Croak P 06/30/2014 4:24 PM

## 2014-06-30 NOTE — ED Notes (Signed)
Patient c/o anxiety.  Ativan 1mg  requested and received.

## 2014-06-30 NOTE — ED Notes (Signed)
Patient belongings are one large black purse, white jacket and black clothing. Patient has 2 patient belonging bags. Security has wanded the patient and belongings, the bags are at the triage nursing station.

## 2014-06-30 NOTE — ED Provider Notes (Signed)
CSN: [id number]     Arrival date & time 06/30/14  1017 History   First MD Initiated Contact with Patient 06/30/14 1056     Chief Complaint  Patient presents with  . Depression  . Medical Clearance     (Consider location/radiation/quality/duration/timing/severity/associated sxs/prior Treatment) HPI  Patient to the ER with complaints of being depressed over the past month. She was sent here by her PCP. She was diagnosed with cancer 5 years ago she was told it is lung cancer and that it cannot be treated with chemo or surgery. Her  significant other died of cancer one year ago. She is unable to eat due to a large hiatal hernia that they won't repair and she is anemic. These are the things causing her anxiety and depression. She reports over the past week she has become so severely depressed that she cannot function well. Denies SI/HI, hallucinations, delusions of substance abuse. She has had thoughts of harming herself. She does have family support.  Past Medical History  Diagnosis Date  . lung ca     lung ca  . Iron deficiency anemia   . Hypertension   . GERD (gastroesophageal reflux disease)   . Hiatal hernia   . Positional vertigo   . Anxiety   . Panic attacks    Past Surgical History  Procedure Laterality Date  . Abdominal hysterectomy  1995  . Cholecystectomy  2005  . Appendectomy  2005  . Partial colectomy  2005   Family History  Problem Relation Age of Onset  . Congestive Heart Failure Father   . Congestive Heart Failure Other     paternal aunts/uncles  . Clotting disorder Mother   . Stroke Mother    History  Substance Use Topics  . Smoking status: Never Smoker   . Smokeless tobacco: Not on file  . Alcohol Use: No   OB History    No data available     Review of Systems  10 Systems reviewed and are negative for acute change except as noted in the HPI.     Allergies  Pineapple  Home Medications   Prior to Admission medications   Medication Sig Start  Date End Date Taking? Authorizing Provider  BYSTOLIC 10 MG tablet Take 10 mg by mouth daily. 08/31/12   Historical Provider, MD  estrogens, conjugated, (PREMARIN) 0.3 MG tablet Take 0.3 mg by mouth daily.     Historical Provider, MD  ferrous sulfate 325 (65 FE) MG tablet Take 325 mg by mouth daily with breakfast.    Historical Provider, MD  fluticasone (FLONASE) 50 MCG/ACT nasal spray Place 2 sprays into both nostrils daily as needed for allergies or rhinitis (rhinitis).     Historical Provider, MD  hydrOXYzine (VISTARIL) 25 MG capsule Take 25 mg by mouth 2 (two) times daily.    Historical Provider, MD  LORazepam (ATIVAN) 1 MG tablet Take 1 tablet (1 mg total) by mouth every 8 (eight) hours as needed for anxiety (anxiety). 06/22/14   Evelina Bucy, MD  meclizine (ANTIVERT) 25 MG tablet Take 25 mg by mouth 3 (three) times daily as needed for dizziness (dizziness). For dizziness    Historical Provider, MD  mirtazapine (REMERON) 15 MG tablet TAKE 1 TABLET (15 MG TOTAL) BY MOUTH AT BEDTIME. 06/10/14   Drue Second, NP  mirtazapine (REMERON) 15 MG tablet Take 15 mg by mouth at bedtime.    Historical Provider, MD  omeprazole (PRILOSEC) 20 MG capsule Take 20 mg by mouth daily.  Historical Provider, MD  ondansetron (ZOFRAN ODT) 4 MG disintegrating tablet 1 tablet sublingual q8h PRN nausea/vomiting 06/22/14   Evelina Bucy, MD  ondansetron (ZOFRAN) 4 MG tablet Take 1 tablet (4 mg total) by mouth every 6 (six) hours. 03/05/14   Dorie Rank, MD  predniSONE (DELTASONE) 10 MG tablet Take  4 each am x 2 days,   2 each am x 2 days,  1 each am x 2 days and stop 04/01/14   Tanda Rockers, MD   BP 156/68 mmHg  Pulse 88  Temp(Src) 98.5 F (36.9 C) (Oral)  Resp 20  SpO2 100% Physical Exam  Constitutional: She appears well-developed and well-nourished. No distress.  HENT:  Head: Normocephalic and atraumatic.  Eyes: Pupils are equal, round, and reactive to light.  Neck: Normal range of motion. Neck supple.   Cardiovascular: Normal rate and regular rhythm.   Pulmonary/Chest: Effort normal.  Abdominal: Soft.  Neurological: She is alert.  Skin: Skin is warm and dry.  Psychiatric: Her speech is normal. Her mood appears anxious. She is not agitated and not actively hallucinating. Thought content is not delusional. She exhibits a depressed mood. She expresses no homicidal and no suicidal ideation. She expresses no suicidal plans and no homicidal plans.  Tearful + thoughts of harming self She is attentive.  Nursing note and vitals reviewed.   ED Course  Procedures (including critical care time) Labs Review Labs Reviewed  ACETAMINOPHEN LEVEL  CBC  COMPREHENSIVE METABOLIC PANEL  ETHANOL  SALICYLATE LEVEL  URINE RAPID DRUG SCREEN (HOSP PERFORMED)    Imaging Review No results found.   EKG Interpretation None      MDM   Final diagnoses:  Depression  Malignant poorly differentiated neuroendocrine carcinoma    TTS consult requested for psych eval. She is saying that she has thoughts of harming herself not necessarily suicide.  Home medications reviewed. CBC has resulted and is abnormal but all values are at baseline for patient. Remaining labs are pending. Home medications need pharmacy review- Not ordered at this time.  Filed Vitals:   06/30/14 1037  BP: 156/68  Pulse: 88  Temp: 98.5 F (36.9 C)  Resp: [id number]       Linus Mako, PA-C 06/30/14 1203  Linus Mako, PA-C 06/30/14 [place], MD 06/30/14 [phone]

## 2014-06-30 NOTE — ED Notes (Signed)
Pt resting at present, watching TV, will continue to monitor for safety.

## 2014-06-30 NOTE — ED Notes (Signed)
Pt states he has been feeling depressed for a while now and cant seem to shake it off, has hx of anxiety and does have terminal lung CA per pt. Pt also states that at times she feels like hurting herself but no HI.

## 2014-06-30 NOTE — ED Notes (Signed)
Warm blankets provided.

## 2014-07-01 ENCOUNTER — Encounter (HOSPITAL_COMMUNITY): Payer: Self-pay | Admitting: Registered Nurse

## 2014-07-01 DIAGNOSIS — F4321 Adjustment disorder with depressed mood: Secondary | ICD-10-CM

## 2014-07-01 DIAGNOSIS — R45851 Suicidal ideations: Secondary | ICD-10-CM

## 2014-07-01 DIAGNOSIS — F419 Anxiety disorder, unspecified: Secondary | ICD-10-CM

## 2014-07-01 MED ORDER — LORAZEPAM 0.5 MG PO TABS
0.5000 mg | ORAL_TABLET | Freq: Three times a day (TID) | ORAL | Status: DC
  Administered 2014-07-01 – 2014-07-02 (×5): 0.5 mg via ORAL
  Filled 2014-07-01 (×5): qty 1

## 2014-07-01 MED ORDER — FLUOXETINE HCL 20 MG PO CAPS
20.0000 mg | ORAL_CAPSULE | Freq: Every day | ORAL | Status: DC
  Administered 2014-07-01 – 2014-07-02 (×2): 20 mg via ORAL
  Filled 2014-07-01 (×2): qty 1

## 2014-07-01 NOTE — Consult Note (Signed)
[facility] Face-to-Face Psychiatry Consult   Reason for Consult:  Increased anxiety and depression Referring Physician:  EDP  Ashley Farmer is an 63 y.o. female. 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 lung ca     lung ca  . Iron deficiency anemia   . Hypertension   . GERD (gastroesophageal reflux disease)   . Hiatal hernia   . Positional vertigo   . Anxiety   . Panic attacks    AXIS IV:  other psychosocial or environmental problems AXIS V:  51-60 moderate symptoms  Plan:  Inpatient treatment for stabilization  Subjective:   Ashley Farmer is a 63 y.o. female patient presents to [facility] with complaints of increased anxiety, depression and suicidal ideation.  HPI:  Patient states "I came yesterday cause of my and anxiety and panic attacks.  I don't know what's going on with me or what's causing it.  I just want to get it in check before it gets bad.  Patient states that she also has some depression. Patient states that she is alone a lot because of everyone working. "I talk to my self a lot cause I'm alone.  Patient move to [place] 1 year ago with her sister after she was diagnosed with lung cancer 4 yrs ago.  Patient denies homicidal ideation, psychosis, and paranoia.    HPI Elements:   Location:  Anxiety. Quality:  Depression. Severity:  Passive suicidal ideation. Timing:  1 week. Review of Systems  Respiratory:       History of Lung   Neurological: Negative for dizziness, seizures and loss of consciousness.  Psychiatric/Behavioral: Positive for depression. Negative for hallucinations, memory loss and substance abuse. Suicidal ideas: Passive. The patient is nervous/anxious. The patient does not have insomnia.   All other systems reviewed and are negative.  Family History  Problem Relation Age of Onset  . Congestive  Heart Failure Father   . Congestive Heart Failure Other     paternal aunts/uncles  . Clotting disorder Mother   . Stroke Mother     Past Psychiatric History: Past Medical History  Diagnosis Date  . lung ca     lung ca  . Iron deficiency anemia   . Hypertension   . GERD (gastroesophageal reflux disease)   . Hiatal hernia   . Positional vertigo   . Anxiety   . Panic attacks     reports that she has never smoked. She does not have any smokeless tobacco history on file. She reports that she does not drink alcohol or use illicit drugs. Family History  Problem Relation Age of Onset  . Congestive Heart Failure Father   . Congestive Heart Failure Other     paternal aunts/uncles  . Clotting disorder Mother   . Stroke Mother    Family History Substance Abuse: No Family Supports: Yes, List: Living Arrangements: Other relatives (sister and sister's family) Can pt return to current living arrangement?: Yes Abuse/Neglect [facility]) Physical Abuse: Denies Verbal Abuse: Denies Sexual Abuse: Denies Allergies:   Allergies  Allergen Reactions  . Atarax [Hydroxyzine] Itching, Nausea And Vomiting and Other (See Comments)    Couldn't sleep, dizziness   . Pineapple Rash    ACT Assessment Complete:  Yes:    Educational Status    Risk to Self: Risk to self with the past 6 months  Suicidal Ideation: Yes-Currently Present Suicidal Intent: No Is patient at risk for suicide?: No Suicidal Plan?: No Access to Means: No What has been your use of drugs/alcohol within the last 12 months?: pt denies substance use Previous Attempts/Gestures: No How many times?: 0 Other Self Harm Risks: none Triggers for Past Attempts:  (n/a) Intentional Self Injurious Behavior: None Family Suicide History: Yes (pt's sister tried to commit suicide after a stroke ) Recent stressful life event(s): Other (Comment), Recent negative physical changes (increased anxiety and depression over past two mos) Persecutory  voices/beliefs?: No Depression: Yes Depression Symptoms: Fatigue, Isolating, Tearfulness, Insomnia, Loss of interest in usual pleasures, Feeling worthless/self pity Substance abuse history and/or treatment for substance abuse?: No Suicide prevention information given to non-admitted patients: Not applicable  Risk to Others: Risk to Others within the past 6 months Homicidal Ideation: No Thoughts of Harm to Others: No Current Homicidal Intent: No Current Homicidal Plan: No Access to Homicidal Means: No Identified Victim: none History of harm to others?: No Assessment of Violence: None Noted Violent Behavior Description: pt denies hx violence Does patient have access to weapons?: No Criminal Charges Pending?: No Does patient have a court date: No  Abuse: Abuse/Neglect Assessment (Assessment to be complete while patient is alone) Physical Abuse: Denies Verbal Abuse: Denies Sexual Abuse: Denies Exploitation of patient/patient's resources: Denies Self-Neglect: Denies  Prior Inpatient Therapy: Prior Inpatient Therapy Prior Inpatient Therapy: No Prior Therapy Dates: na Prior Therapy Facilty/Provider(s): na Reason for Treatment: na  Prior Outpatient Therapy: Prior Outpatient Therapy Prior Outpatient Therapy: Yes Prior Therapy Dates: recently Prior Therapy Facilty/Provider(s): [facility] Reason for Treatment: depression/anxiety  Additional Information: Additional Information 1:1 In Past 12 Months?: No CIRT Risk: No Elopement Risk: No Does patient have medical clearance?: Yes                  Objective: Blood pressure 126/53, pulse 68, temperature 98.6 F (37 C), temperature source Oral, resp. rate 18, SpO2 100 %.There is no weight on file to calculate BMI. Results for orders placed or performed during the hospital encounter of 06/30/14 (from the past 72 hour(s))  Acetaminophen level     Status: None   Collection Time: 06/30/14 11:38 AM  Result Value Ref Range    Acetaminophen (Tylenol), Serum <15.0 10 - 30 ug/mL    Comment:        THERAPEUTIC CONCENTRATIONS VARY SIGNIFICANTLY. A RANGE OF 10-30 ug/mL MAY BE AN EFFECTIVE CONCENTRATION FOR MANY PATIENTS. HOWEVER, SOME ARE BEST TREATED AT CONCENTRATIONS OUTSIDE THIS RANGE. ACETAMINOPHEN CONCENTRATIONS >150 ug/mL AT 4 HOURS AFTER INGESTION AND >50 ug/mL AT 12 HOURS AFTER INGESTION ARE OFTEN ASSOCIATED WITH TOXIC REACTIONS.   CBC     Status: Abnormal   Collection Time: 06/30/14 11:38 AM  Result Value Ref Range   WBC 10.4 4.0 - 10.5 K/uL   RBC 4.78 3.87 - 5.11 MIL/uL   Hemoglobin 10.7 (L) 12.0 - 15.0 g/dL   HCT 36.1 36.0 - 46.0 %   MCV 75.5 (L) 78.0 - 100.0 fL   MCH 22.4 (L) 26.0 - 34.0 pg   MCHC 29.6 (L) 30.0 - 36.0 g/dL   RDW 16.4 (H) 11.5 - 15.5 %   Platelets 433 (H) 150 - 400 K/uL  Comprehensive metabolic panel     Status: Abnormal   Collection Time: 06/30/14 11:38 AM  Result Value Ref Range   Sodium 141 137 - 147 mEq/L   Potassium 4.0 3.7 - 5.3 mEq/L   Chloride 101 96 -  112 mEq/L   CO2 25 19 - 32 mEq/L   Glucose, Bld 82 70 - 99 mg/dL   BUN 8 6 - 23 mg/dL   Creatinine, Ser 0.95 0.50 - 1.10 mg/dL   Calcium 9.8 8.4 - 10.5 mg/dL   Total Protein 8.6 (H) 6.0 - 8.3 g/dL   Albumin 0.2 (L) 3.5 - 5.2 g/dL   AST 13 0 - 37 U/L   ALT 9 0 - 35 U/L   Alkaline Phosphatase 5 (L) 39 - 117 U/L   Total Bilirubin 0.4 0.3 - 1.2 mg/dL   GFR calc non Af Amer 62 (L) >90 mL/min   GFR calc Af Amer 72 (L) >90 mL/min    Comment: (NOTE) The eGFR has been calculated using the CKD EPI equation. This calculation has not been validated in all clinical situations. eGFR's persistently <90 mL/min signify possible Chronic Kidney Disease.    Anion gap 15 5 - 15  Ethanol (ETOH)     Status: None   Collection Time: 06/30/14 11:38 AM  Result Value Ref Range   Alcohol, Ethyl (B) <11 0 - 11 mg/dL    Comment:        LOWEST DETECTABLE LIMIT FOR SERUM ALCOHOL IS 11 mg/dL FOR MEDICAL PURPOSES ONLY   Salicylate  level     Status: Abnormal   Collection Time: 06/30/14 11:38 AM  Result Value Ref Range   Salicylate Lvl <6.3 (L) 2.8 - 20.0 mg/dL  Urine Drug Screen     Status: None   Collection Time: 06/30/14 12:30 PM  Result Value Ref Range   Opiates NONE DETECTED NONE DETECTED   Cocaine NONE DETECTED NONE DETECTED   Benzodiazepines NONE DETECTED NONE DETECTED   Amphetamines NONE DETECTED NONE DETECTED   Tetrahydrocannabinol NONE DETECTED NONE DETECTED   Barbiturates NONE DETECTED NONE DETECTED    Comment:        DRUG SCREEN FOR MEDICAL PURPOSES ONLY.  IF CONFIRMATION IS NEEDED FOR ANY PURPOSE, NOTIFY LAB WITHIN 5 DAYS.        LOWEST DETECTABLE LIMITS FOR URINE DRUG SCREEN Drug Class       Cutoff (ng/mL) Amphetamine      1000 Barbiturate      200 Benzodiazepine   846 Tricyclics       659 Opiates          300 Cocaine          300 THC              50    Labs are reviewed  See abnormal values above.  Medications reviewed.  Decreased Ativan to 0.5 mg Tid prn anxiety and Started Prozac 20 mg daily  Current Facility-Administered Medications  Medication Dose Route Frequency Provider Last Rate Last Dose  . acetaminophen (TYLENOL) tablet 650 mg  650 mg Oral Q4H PRN Linus Mako, PA-C      . alum & mag hydroxide-simeth (MAALOX/MYLANTA) 200-200-20 MG/5ML suspension 30 mL  30 mL Oral PRN Linus Mako, PA-C      . estrogens (conjugated) (PREMARIN) tablet 0.3 mg  0.3 mg Oral QPM Linus Mako, PA-C   0.3 mg at 06/30/14 1808  . ferrous sulfate tablet 325 mg  325 mg Oral Q breakfast Linus Mako, PA-C   325 mg at 07/01/14 0947  . FLUoxetine (PROZAC) capsule 20 mg  20 mg Oral Daily Shuvon Rankin, NP      . fluticasone (FLONASE) 50 MCG/ACT nasal spray 2 spray  2 spray  Each Nare Daily PRN Linus Mako, PA-C      . ibuprofen (ADVIL,MOTRIN) tablet 600 mg  600 mg Oral Q8H PRN Linus Mako, PA-C      . LORazepam (ATIVAN) tablet 0.5 mg  0.5 mg Oral TID Shuvon Rankin, NP      . meclizine  (ANTIVERT) tablet 25 mg  25 mg Oral TID PRN Linus Mako, PA-C      . mirtazapine (REMERON) tablet 15 mg  15 mg Oral QHS Linus Mako, PA-C   15 mg at 06/30/14 2134  . nebivolol (BYSTOLIC) tablet 10 mg  10 mg Oral Daily Linus Mako, PA-C   10 mg at 07/01/14 0947  . nicotine (NICODERM CQ - dosed in mg/24 hours) patch 21 mg  21 mg Transdermal Daily Linus Mako, PA-C   21 mg at 06/30/14 1235  . ondansetron (ZOFRAN) tablet 4 mg  4 mg Oral Q8H PRN Linus Mako, PA-C      . ondansetron [facility]) tablet 4 mg  4 mg Oral Q6H Linus Mako, PA-C   4 mg at 07/01/14 0946  . pantoprazole (PROTONIX) EC tablet 40 mg  40 mg Oral Daily Linus Mako, PA-C   40 mg at 07/01/14 0017   Current Outpatient Prescriptions  Medication Sig Dispense Refill  . BYSTOLIC 10 MG tablet Take 10 mg by mouth daily.    Marland Kitchen estrogens, conjugated, (PREMARIN) 0.3 MG tablet Take 0.3 mg by mouth every evening.     . ferrous sulfate 325 (65 FE) MG tablet Take 325 mg by mouth daily with breakfast.    . fluticasone (FLONASE) 50 MCG/ACT nasal spray Place 2 sprays into both nostrils daily as needed for allergies or rhinitis (rhinitis).     . LORazepam (ATIVAN) 1 MG tablet Take 1 tablet (1 mg total) by mouth every 8 (eight) hours as needed for anxiety (anxiety). 15 tablet 0  . meclizine (ANTIVERT) 25 MG tablet Take 25 mg by mouth 3 (three) times daily as needed for dizziness (dizziness). For dizziness    . mirtazapine (REMERON) 15 MG tablet Take 15 mg by mouth at bedtime.    Marland Kitchen omeprazole (PRILOSEC) 20 MG capsule Take 20 mg by mouth daily with breakfast.     . ondansetron (ZOFRAN) 4 MG tablet Take 1 tablet (4 mg total) by mouth every 6 (six) hours. 12 tablet 0  . hydrOXYzine (VISTARIL) 25 MG capsule Take 25 mg by mouth 2 (two) times daily.    . mirtazapine (REMERON) 15 MG tablet TAKE 1 TABLET (15 MG TOTAL) BY MOUTH AT BEDTIME. 30 tablet 0  . ondansetron (ZOFRAN ODT) 4 MG disintegrating tablet 1 tablet sublingual q8h  PRN nausea/vomiting 10 tablet 0  . predniSONE (DELTASONE) 10 MG tablet Take  4 each am x 2 days,   2 each am x 2 days,  1 each am x 2 days and stop 14 tablet 0    Psychiatric Specialty Exam:     Blood pressure 126/53, pulse 68, temperature 98.6 F (37 C), temperature source Oral, resp. rate 18, SpO2 100 %.There is no weight on file to calculate BMI.  General Appearance: Casual and Fairly Groomed  Eye Contact::  Good  Speech:  Clear and Coherent and Normal Rate  Volume:  Normal  Mood:  Anxious and Depressed  Affect:  Congruent and Depressed  Thought Process:  Circumstantial and Goal Directed  Orientation:  Full (Time, Place, and Person)  Thought Content:  "I just want  to get this in check"  Suicidal Thoughts:  Yes.  without intent/plan  Homicidal Thoughts:  No  Memory:  Immediate;   Good Recent;   Good Remote;   Good  Judgement:  Intact  Insight:  Fair  Psychomotor Activity:  Normal  Concentration:  Fair  Recall:  Good  Fund of Knowledge:Good  Language: Good  Akathisia:  No  Handed:  Right  AIMS (if indicated):     Assets:  Communication Skills Desire for Improvement Housing Social Support  Sleep:      Musculoskeletal: Strength & Muscle Tone: within normal limits Gait & Station: normal Patient leans: N/A  Treatment Plan Summary: Daily contact with patient to assess and evaluate symptoms and progress in treatment Medication management Inpatient treatment for stablization  Earleen Newport, FNP-BC 07/01/2014 11:51 AM  Patient seen, evaluated and I agree with notes by Nurse Practitioner. Corena Pilgrim, MD

## 2014-07-01 NOTE — ED Notes (Signed)
Patient calm, cooperative. Reports passive SI. Denies HI, AVH. Rates anxiety 8/10, feelings of depression 9/10.   Encouragement offered. Given shower supplies.  Q 15 safety checks continue.

## 2014-07-01 NOTE — ED Notes (Signed)
D:Pt has a flat/sad affect. Pt reports passive si thoughts and recent anxiety attacks. Pt reports that she is alone often with family members at work. She has one son that lives in [place]. Pt moved to [place] four years ago to live with her sister and her sister's family after she was diagnosed with lung CA. A:Offered support, encouragement and 15 minute checks. R:Safety maintained in the SAP[facility].

## 2014-07-02 ENCOUNTER — Encounter (HOSPITAL_COMMUNITY): Payer: Self-pay | Admitting: *Deleted

## 2014-07-02 ENCOUNTER — Inpatient Hospital Stay (HOSPITAL_COMMUNITY)
Admission: AD | Admit: 2014-07-02 | Discharge: 2014-07-16 | DRG: 885 | Disposition: A | Discharge status: Home / Self Care | Payer: Medicare Other | Source: Intra-hospital | Attending: Psychiatry | Admitting: Psychiatry

## 2014-07-02 DIAGNOSIS — F410 Panic disorder [episodic paroxysmal anxiety] without agoraphobia: Secondary | ICD-10-CM | POA: Diagnosis not present

## 2014-07-02 DIAGNOSIS — R42 Dizziness and giddiness: Secondary | ICD-10-CM | POA: Diagnosis present

## 2014-07-02 DIAGNOSIS — R45851 Suicidal ideations: Secondary | ICD-10-CM | POA: Diagnosis present

## 2014-07-02 DIAGNOSIS — K219 Gastro-esophageal reflux disease without esophagitis: Secondary | ICD-10-CM | POA: Diagnosis present

## 2014-07-02 DIAGNOSIS — F4001 Agoraphobia with panic disorder: Secondary | ICD-10-CM | POA: Diagnosis present

## 2014-07-02 DIAGNOSIS — Z823 Family history of stroke: Secondary | ICD-10-CM

## 2014-07-02 DIAGNOSIS — G4700 Insomnia, unspecified: Secondary | ICD-10-CM | POA: Diagnosis present

## 2014-07-02 DIAGNOSIS — H8110 Benign paroxysmal vertigo, unspecified ear: Secondary | ICD-10-CM | POA: Diagnosis present

## 2014-07-02 DIAGNOSIS — C3490 Malignant neoplasm of unspecified part of unspecified bronchus or lung: Secondary | ICD-10-CM | POA: Diagnosis present

## 2014-07-02 DIAGNOSIS — D509 Iron deficiency anemia, unspecified: Secondary | ICD-10-CM | POA: Diagnosis present

## 2014-07-02 DIAGNOSIS — F322 Major depressive disorder, single episode, severe without psychotic features: Secondary | ICD-10-CM | POA: Diagnosis present

## 2014-07-02 DIAGNOSIS — I10 Essential (primary) hypertension: Secondary | ICD-10-CM | POA: Diagnosis present

## 2014-07-02 DIAGNOSIS — Z8249 Family history of ischemic heart disease and other diseases of the circulatory system: Secondary | ICD-10-CM

## 2014-07-02 DIAGNOSIS — F329 Major depressive disorder, single episode, unspecified: Secondary | ICD-10-CM | POA: Diagnosis not present

## 2014-07-02 MED ORDER — PANTOPRAZOLE SODIUM 40 MG PO TBEC
40.0000 mg | DELAYED_RELEASE_TABLET | Freq: Every day | ORAL | Status: DC
  Administered 2014-07-03 – 2014-07-16 (×14): 40 mg via ORAL
  Filled 2014-07-02 (×18): qty 1

## 2014-07-02 MED ORDER — ONDANSETRON HCL 4 MG PO TABS
4.0000 mg | ORAL_TABLET | Freq: Four times a day (QID) | ORAL | Status: DC
  Administered 2014-07-03 – 2014-07-16 (×40): 4 mg via ORAL
  Filled 2014-07-02 (×63): qty 1

## 2014-07-02 MED ORDER — ESTROGENS CONJUGATED 0.3 MG PO TABS
0.3000 mg | ORAL_TABLET | Freq: Every evening | ORAL | Status: DC
  Administered 2014-07-03 – 2014-07-15 (×13): 0.3 mg via ORAL
  Filled 2014-07-02 (×15): qty 1

## 2014-07-02 MED ORDER — MIRTAZAPINE 15 MG PO TABS
15.0000 mg | ORAL_TABLET | Freq: Every day | ORAL | Status: DC
  Administered 2014-07-02 – 2014-07-06 (×5): 15 mg via ORAL
  Filled 2014-07-02 (×7): qty 1

## 2014-07-02 MED ORDER — FERROUS SULFATE 325 (65 FE) MG PO TABS
325.0000 mg | ORAL_TABLET | Freq: Every day | ORAL | Status: DC
  Administered 2014-07-03 – 2014-07-16 (×14): 325 mg via ORAL
  Filled 2014-07-02 (×17): qty 1

## 2014-07-02 MED ORDER — FLUTICASONE PROPIONATE 50 MCG/ACT NA SUSP: 2.0000 | Freq: Every day | NASAL | Status: DC | PRN

## 2014-07-02 MED ORDER — NICOTINE 21 MG/24HR TD PT24
21.0000 mg | MEDICATED_PATCH | Freq: Every day | TRANSDERMAL | Status: DC
  Filled 2014-07-02 (×4): qty 1

## 2014-07-02 MED ORDER — IBUPROFEN 600 MG PO TABS: 600.0000 mg | ORAL_TABLET | Freq: Three times a day (TID) | ORAL | Status: DC | PRN

## 2014-07-02 MED ORDER — MAGNESIUM HYDROXIDE 400 MG/5ML PO SUSP: 30.0000 mL | Freq: Every day | ORAL | Status: DC | PRN

## 2014-07-02 MED ORDER — ONDANSETRON HCL 4 MG PO TABS: 4.0000 mg | ORAL_TABLET | Freq: Three times a day (TID) | ORAL | Status: DC | PRN

## 2014-07-02 MED ORDER — NEBIVOLOL HCL 10 MG PO TABS
10.0000 mg | ORAL_TABLET | Freq: Every day | ORAL | Status: DC
  Administered 2014-07-03 – 2014-07-16 (×14): 10 mg via ORAL
  Filled 2014-07-02: qty 4
  Filled 2014-07-02 (×15): qty 1

## 2014-07-02 MED ORDER — MECLIZINE HCL 25 MG PO TABS
25.0000 mg | ORAL_TABLET | Freq: Three times a day (TID) | ORAL | Status: DC | PRN
  Administered 2014-07-03 – 2014-07-12 (×14): 25 mg via ORAL
  Filled 2014-07-02 (×21): qty 1

## 2014-07-02 MED ORDER — FLUOXETINE HCL 20 MG PO CAPS
20.0000 mg | ORAL_CAPSULE | Freq: Every day | ORAL | Status: DC
  Administered 2014-07-03 – 2014-07-08 (×6): 20 mg via ORAL
  Filled 2014-07-02 (×8): qty 1

## 2014-07-02 MED ORDER — LORAZEPAM 0.5 MG PO TABS
0.5000 mg | ORAL_TABLET | Freq: Three times a day (TID) | ORAL | Status: DC
  Administered 2014-07-03 – 2014-07-16 (×41): 0.5 mg via ORAL
  Filled 2014-07-02 (×40): qty 1

## 2014-07-02 NOTE — Consult Note (Signed)
[facility] Face-to-Face Psychiatry Consult   Reason for Consult:  Increased anxiety and depression Referring Physician:  EDP  Ashley Farmer is an 63 y.o. female. 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 lung ca     lung ca  . Iron deficiency anemia   . Hypertension   . GERD (gastroesophageal reflux disease)   . Hiatal hernia   . Positional vertigo   . Anxiety   . Panic attacks    AXIS IV:  other psychosocial or environmental problems AXIS V:  51-60 moderate symptoms  Plan:  Inpatient treatment for stabilization  Subjective:   Ashley Farmer is a 63 y.o. female patient presents to [facility] with complaints of increased anxiety, depression and suicidal ideation.  HPI:  Patient states "I came yesterday cause of my and anxiety and panic attacks.  I don't know what's going on with me or what's causing it.  I just want to get it in check before it gets bad.  Patient states that she also has some depression. Patient states that she is alone a lot because of everyone working. "I talk to my self a lot cause I'm alone.  Patient move to [place] 1 year ago with her sister after she was diagnosed with lung cancer 4 yrs ago.  Patient denies homicidal ideation, psychosis, and paranoia.    Today:Patient states that she continues to be depressed and unable to contract for safety.  "I just feel like nothing is going to change and I will always be caught in this dark place."  HPI Elements:   Location:  Anxiety. Quality:  Depression. Severity:  Passive suicidal ideation. Timing:  1 week. Review of Systems  Respiratory:       History of Lung   Neurological: Negative for dizziness, seizures and loss of consciousness.  Psychiatric/Behavioral: Positive for depression. Negative for hallucinations, memory loss and substance abuse. Suicidal ideas:  Passive. The patient is nervous/anxious. The patient does not have insomnia.   All other systems reviewed and are negative.  Family History  Problem Relation Age of Onset  . Congestive Heart Failure Father   . Congestive Heart Failure Other     paternal aunts/uncles  . Clotting disorder Mother   . Stroke Mother     Past Psychiatric History: Past Medical History  Diagnosis Date  . lung ca     lung ca  . Iron deficiency anemia   . Hypertension   . GERD (gastroesophageal reflux disease)   . Hiatal hernia   . Positional vertigo   . Anxiety   . Panic attacks     reports that she has never smoked. She does not have any smokeless tobacco history on file. She reports that she does not drink alcohol or use illicit drugs. Family History  Problem Relation Age of Onset  . Congestive Heart Failure Father   . Congestive Heart Failure Other     paternal aunts/uncles  . Clotting disorder Mother   . Stroke Mother    Family History Substance Abuse: No Family Supports: Yes, List: Living Arrangements: Other relatives (sister and sister's family) Can pt return to current living arrangement?: Yes Abuse/Neglect [facility]) Physical Abuse: Denies Verbal Abuse: Denies Sexual Abuse: Denies Allergies:   Allergies  Allergen Reactions  . Atarax [Hydroxyzine] Itching, Nausea And Vomiting and Other (See Comments)  Couldn't sleep, dizziness   . Pineapple Rash    ACT Assessment Complete:  Yes:    Educational Status    Risk to Self: Risk to self with the past 6 months Suicidal Ideation: Yes-Currently Present Suicidal Intent: No Is patient at risk for suicide?: No Suicidal Plan?: No Access to Means: No What has been your use of drugs/alcohol within the last 12 months?: pt denies substance use Previous Attempts/Gestures: No How many times?: 0 Other Self Harm Risks: none Triggers for Past Attempts:  (n/a) Intentional Self Injurious Behavior: None Family Suicide History: Yes (pt's sister tried  to commit suicide after a stroke ) Recent stressful life event(s): Other (Comment), Recent negative physical changes (increased anxiety and depression over past two mos) Persecutory voices/beliefs?: No Depression: Yes Depression Symptoms: Fatigue, Isolating, Tearfulness, Insomnia, Loss of interest in usual pleasures, Feeling worthless/self pity Substance abuse history and/or treatment for substance abuse?: No Suicide prevention information given to non-admitted patients: Not applicable  Risk to Others: Risk to Others within the past 6 months Homicidal Ideation: No Thoughts of Harm to Others: No Current Homicidal Intent: No Current Homicidal Plan: No Access to Homicidal Means: No Identified Victim: none History of harm to others?: No Assessment of Violence: None Noted Violent Behavior Description: pt denies hx violence Does patient have access to weapons?: No Criminal Charges Pending?: No Does patient have a court date: No  Abuse: Abuse/Neglect Assessment (Assessment to be complete while patient is alone) Physical Abuse: Denies Verbal Abuse: Denies Sexual Abuse: Denies Exploitation of patient/patient's resources: Denies Self-Neglect: Denies  Prior Inpatient Therapy: Prior Inpatient Therapy Prior Inpatient Therapy: No Prior Therapy Dates: na Prior Therapy Facilty/Provider(s): na Reason for Treatment: na  Prior Outpatient Therapy: Prior Outpatient Therapy Prior Outpatient Therapy: Yes Prior Therapy Dates: recently Prior Therapy Facilty/Provider(s): [facility] Reason for Treatment: depression/anxiety  Additional Information: Additional Information 1:1 In Past 12 Months?: No CIRT Risk: No Elopement Risk: No Does patient have medical clearance?: Yes                  Objective: Blood pressure 144/54, pulse 73, temperature 97.8 F (36.6 C), temperature source Oral, resp. rate 20, SpO2 100 %.There is no weight on file to calculate BMI. Results for orders placed  or performed during the hospital encounter of 06/30/14 (from the past 72 hour(s))  Acetaminophen level     Status: None   Collection Time: 06/30/14 11:38 AM  Result Value Ref Range   Acetaminophen (Tylenol), Serum <15.0 10 - 30 ug/mL    Comment:        THERAPEUTIC CONCENTRATIONS VARY SIGNIFICANTLY. A RANGE OF 10-30 ug/mL MAY BE AN EFFECTIVE CONCENTRATION FOR MANY PATIENTS. HOWEVER, SOME ARE BEST TREATED AT CONCENTRATIONS OUTSIDE THIS RANGE. ACETAMINOPHEN CONCENTRATIONS >150 ug/mL AT 4 HOURS AFTER INGESTION AND >50 ug/mL AT 12 HOURS AFTER INGESTION ARE OFTEN ASSOCIATED WITH TOXIC REACTIONS.   CBC     Status: Abnormal   Collection Time: 06/30/14 11:38 AM  Result Value Ref Range   WBC 10.4 4.0 - 10.5 K/uL   RBC 4.78 3.87 - 5.11 MIL/uL   Hemoglobin 10.7 (L) 12.0 - 15.0 g/dL   HCT 36.1 36.0 - 46.0 %   MCV 75.5 (L) 78.0 - 100.0 fL   MCH 22.4 (L) 26.0 - 34.0 pg   MCHC 29.6 (L) 30.0 - 36.0 g/dL   RDW 16.4 (H) 11.5 - 15.5 %   Platelets 433 (H) 150 - 400 K/uL  Comprehensive metabolic panel     Status:  Abnormal   Collection Time: 06/30/14 11:38 AM  Result Value Ref Range   Sodium 141 137 - 147 mEq/L   Potassium 4.0 3.7 - 5.3 mEq/L   Chloride 101 96 - 112 mEq/L   CO2 25 19 - 32 mEq/L   Glucose, Bld 82 70 - 99 mg/dL   BUN 8 6 - 23 mg/dL   Creatinine, Ser 0.95 0.50 - 1.10 mg/dL   Calcium 9.8 8.4 - 10.5 mg/dL   Total Protein 8.6 (H) 6.0 - 8.3 g/dL   Albumin 0.2 (L) 3.5 - 5.2 g/dL   AST 13 0 - 37 U/L   ALT 9 0 - 35 U/L   Alkaline Phosphatase 5 (L) 39 - 117 U/L   Total Bilirubin 0.4 0.3 - 1.2 mg/dL   GFR calc non Af Amer 62 (L) >90 mL/min   GFR calc Af Amer 72 (L) >90 mL/min    Comment: (NOTE) The eGFR has been calculated using the CKD EPI equation. This calculation has not been validated in all clinical situations. eGFR's persistently <90 mL/min signify possible Chronic Kidney Disease.    Anion gap 15 5 - 15  Ethanol (ETOH)     Status: None   Collection Time: 06/30/14 11:38  AM  Result Value Ref Range   Alcohol, Ethyl (B) <11 0 - 11 mg/dL    Comment:        LOWEST DETECTABLE LIMIT FOR SERUM ALCOHOL IS 11 mg/dL FOR MEDICAL PURPOSES ONLY   Salicylate level     Status: Abnormal   Collection Time: 06/30/14 11:38 AM  Result Value Ref Range   Salicylate Lvl <9.6 (L) 2.8 - 20.0 mg/dL  Urine Drug Screen     Status: None   Collection Time: 06/30/14 12:30 PM  Result Value Ref Range   Opiates NONE DETECTED NONE DETECTED   Cocaine NONE DETECTED NONE DETECTED   Benzodiazepines NONE DETECTED NONE DETECTED   Amphetamines NONE DETECTED NONE DETECTED   Tetrahydrocannabinol NONE DETECTED NONE DETECTED   Barbiturates NONE DETECTED NONE DETECTED    Comment:        DRUG SCREEN FOR MEDICAL PURPOSES ONLY.  IF CONFIRMATION IS NEEDED FOR ANY PURPOSE, NOTIFY LAB WITHIN 5 DAYS.        LOWEST DETECTABLE LIMITS FOR URINE DRUG SCREEN Drug Class       Cutoff (ng/mL) Amphetamine      1000 Barbiturate      200 Benzodiazepine   283 Tricyclics       662 Opiates          300 Cocaine          300 THC              50    Labs are reviewed  See abnormal values above.  Medications reviewed.  Decreased Ativan to 0.5 mg Tid prn anxiety and Started Prozac 20 mg daily  Current Facility-Administered Medications  Medication Dose Route Frequency Provider Last Rate Last Dose  . acetaminophen (TYLENOL) tablet 650 mg  650 mg Oral Q4H PRN Linus Mako, PA-C      . alum & mag hydroxide-simeth (MAALOX/MYLANTA) 200-200-20 MG/5ML suspension 30 mL  30 mL Oral PRN Linus Mako, PA-C      . estrogens (conjugated) (PREMARIN) tablet 0.3 mg  0.3 mg Oral QPM Linus Mako, PA-C   0.3 mg at 07/01/14 1717  . ferrous sulfate tablet 325 mg  325 mg Oral Q breakfast Linus Mako, PA-C   325 mg  at 07/02/14 0950  . FLUoxetine (PROZAC) capsule 20 mg  20 mg Oral Daily Shuvon Rankin, NP   20 mg at 07/02/14 0950  . fluticasone (FLONASE) 50 MCG/ACT nasal spray 2 spray  2 spray Each Nare Daily PRN  Linus Mako, PA-C      . ibuprofen (ADVIL,MOTRIN) tablet 600 mg  600 mg Oral Q8H PRN Linus Mako, PA-C      . LORazepam (ATIVAN) tablet 0.5 mg  0.5 mg Oral TID Shuvon Rankin, NP   0.5 mg at 07/02/14 1544  . meclizine (ANTIVERT) tablet 25 mg  25 mg Oral TID PRN Linus Mako, PA-C      . mirtazapine (REMERON) tablet 15 mg  15 mg Oral QHS Linus Mako, PA-C   15 mg at 07/01/14 2218  . nebivolol (BYSTOLIC) tablet 10 mg  10 mg Oral Daily Linus Mako, PA-C   10 mg at 07/01/14 0947  . nicotine (NICODERM CQ - dosed in mg/24 hours) patch 21 mg  21 mg Transdermal Daily Linus Mako, PA-C   21 mg at 06/30/14 1235  . ondansetron (ZOFRAN) tablet 4 mg  4 mg Oral Q8H PRN Linus Mako, PA-C      . ondansetron [facility]) tablet 4 mg  4 mg Oral Q6H Linus Mako, PA-C   4 mg at 07/01/14 0946  . pantoprazole (PROTONIX) EC tablet 40 mg  40 mg Oral Daily Linus Mako, PA-C   40 mg at 07/02/14 1610   Current Outpatient Prescriptions  Medication Sig Dispense Refill  . BYSTOLIC 10 MG tablet Take 10 mg by mouth daily.    Marland Kitchen estrogens, conjugated, (PREMARIN) 0.3 MG tablet Take 0.3 mg by mouth every evening.     . ferrous sulfate 325 (65 FE) MG tablet Take 325 mg by mouth daily with breakfast.    . fluticasone (FLONASE) 50 MCG/ACT nasal spray Place 2 sprays into both nostrils daily as needed for allergies or rhinitis (rhinitis).     . LORazepam (ATIVAN) 1 MG tablet Take 1 tablet (1 mg total) by mouth every 8 (eight) hours as needed for anxiety (anxiety). 15 tablet 0  . meclizine (ANTIVERT) 25 MG tablet Take 25 mg by mouth 3 (three) times daily as needed for dizziness (dizziness). For dizziness    . mirtazapine (REMERON) 15 MG tablet Take 15 mg by mouth at bedtime.    Marland Kitchen omeprazole (PRILOSEC) 20 MG capsule Take 20 mg by mouth daily with breakfast.     . ondansetron (ZOFRAN) 4 MG tablet Take 1 tablet (4 mg total) by mouth every 6 (six) hours. 12 tablet 0  . hydrOXYzine (VISTARIL) 25 MG  capsule Take 25 mg by mouth 2 (two) times daily.    . mirtazapine (REMERON) 15 MG tablet TAKE 1 TABLET (15 MG TOTAL) BY MOUTH AT BEDTIME. 30 tablet 0  . ondansetron (ZOFRAN ODT) 4 MG disintegrating tablet 1 tablet sublingual q8h PRN nausea/vomiting 10 tablet 0  . predniSONE (DELTASONE) 10 MG tablet Take  4 each am x 2 days,   2 each am x 2 days,  1 each am x 2 days and stop 14 tablet 0    Psychiatric Specialty Exam:     Blood pressure 144/54, pulse 73, temperature 97.8 F (36.6 C), temperature source Oral, resp. rate 20, SpO2 100 %.There is no weight on file to calculate BMI.  General Appearance: Casual and Fairly Groomed  Eye Contact::  Good  Speech:  Clear and Coherent and  Normal Rate  Volume:  Normal  Mood:  Anxious and Depressed  Affect:  Congruent and Depressed  Thought Process:  Circumstantial and Goal Directed  Orientation:  Full (Time, Place, and Person)  Thought Content:  "I just feel like I will alway be caught in this dark place"  Suicidal Thoughts:  Yes.  without intent/plan  Homicidal Thoughts:  No  Memory:  Immediate;   Good Recent;   Good Remote;   Good  Judgement:  Intact  Insight:  Fair  Psychomotor Activity:  Normal  Concentration:  Fair  Recall:  Good  Fund of Knowledge:Good  Language: Good  Akathisia:  No  Handed:  Right  AIMS (if indicated):     Assets:  Communication Skills Desire for Improvement Housing Social Support  Sleep:      Musculoskeletal: Strength & Muscle Tone: within normal limits Gait & Station: normal Patient leans: N/A  Treatment Plan Summary: Daily contact with patient to assess and evaluate symptoms and progress in treatment Medication management Inpatient treatment for stablization   Will continue with current treatment plan for inpatient treatment for stabilization  Rankin, Shuvon, FNP-BC 07/02/2014 4:55 PM    Patient seen, evaluated and I agree with notes by Nurse Practitioner. Corena Pilgrim, MD

## 2014-07-02 NOTE — Progress Notes (Signed)
Faxed referral documents to [facility] ([phone]). Called geriatric psych at [facility] ([phone]) to let them know to expect a fax and follow up.   Bedelia Person, M.S., LPCA, [facility] Licensed Professional Counselor Associate  Triage Specialist  [facility]  Therapeutic Triage Services Phone: [phone] Fax: [phone]

## 2014-07-02 NOTE — Progress Notes (Addendum)
This is a 63 years old African American Female admitted to the unit for Depression and suicide thoughts. Patient reported this is her first admission at [facility] Her mood and affects sad and depressed and she appeared irritable and sad and depressed. She endorsed passive SI, denied Hi , denied a/v hallucinations. She verbally contracted for safety. Writer encouraged and supported patient. Q 15 minute check initiated.

## 2014-07-02 NOTE — ED Notes (Addendum)
Patient reports passive SI. Denies HI, AVH. Rates feelings of anxiety 5/10, feelings of depression 8/10. Reports mild nausea and on going feelings of dizziness.  Encouragement offered. Declined Zofran.  Q 15 safety checks continue.

## 2014-07-02 NOTE — Tx Team (Signed)
Initial Interdisciplinary Treatment Plan   PATIENT STRESSORS: Health problems   PATIENT STRENGTHS: Ability for insight Communication skills Supportive family/friends   PROBLEM LIST: Problem List/Patient Goals Date to be addressed Date deferred Reason deferred Estimated date of resolution  Depression 07/03/14                                                      DISCHARGE CRITERIA:  Improved stabilization in mood, thinking, and/or behavior Motivation to continue treatment in a less acute level of care Verbal commitment to aftercare and medication compliance  PRELIMINARY DISCHARGE PLAN: Return to previous living arrangement  PATIENT/FAMIILY INVOLVEMENT: This treatment plan has been presented to and reviewed with the patient, Ashley Farmer, and/or family member.  The patient and family have been given the opportunity to ask questions and make suggestions.  Ashley Farmer [facility] 07/02/2014, 10:35 PM

## 2014-07-02 NOTE — Progress Notes (Signed)
Pt signed support paperwork and will be going to [facility] inpatient bed 300-2. RN was notified and given number to call report [phone]).   Bedelia Person, M.S., LPCA, [facility] Licensed Professional Counselor Associate  Triage Specialist  [facility]  Therapeutic Triage Services Phone: [phone] Fax: [phone]

## 2014-07-03 DIAGNOSIS — F4001 Agoraphobia with panic disorder: Secondary | ICD-10-CM
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
## 2014-07-03 NOTE — Progress Notes (Signed)
Recreation Therapy Notes  Animal-Assisted Activity/Therapy (AAA/T) Program Checklist/Progress Notes Patient Eligibility Criteria Checklist & Daily Group note for Rec Tx Intervention  Date: 11.19.2015 Time: 2:45pm Location: 54 Film/video editor    AAA/T Program Assumption of Risk Form signed by Patient/ or Parent Legal Guardian yes  Patient is free of allergies or sever asthma yes  Patient reports no fear of animals yes  Patient reports no history of cruelty to animals yes   Patient understands his/her participation is voluntary yes  Patient washes hands before animal contact yes  Patient washes hands after animal contact yes  Behavioral Response: Appropriate   Education: Hand Washing, Appropriate Animal Interaction   Education Outcome: Acknowledges education.   Clinical Observations/Feedback: Patient arrived to session at approximately 3:15pm, upon arrival patient engaged in session, petting therapy dog appropriately.   Laureen Ochs Joylene Wescott, LRT/CTRS  Lane Hacker 07/03/2014 4:17 PM

## 2014-07-03 NOTE — BHH Suicide Risk Assessment (Signed)
   Nursing information obtained from:  Patient Demographic factors:  NA Current Mental Status:  Self-harm thoughts Loss Factors:  Decline in physical health Historical Factors:  NA Risk Reduction Factors:  Positive social support Total Time spent with patient: 45 minutes  CLINICAL FACTORS:  Depression, Panic Disorder  Psychiatric Specialty Exam: Physical Exam  ROS  Blood pressure 145/60, pulse 69, temperature 97.9 F (36.6 C), temperature source Oral, resp. rate 20, height 5\' 6"  (1.676 m), weight 85.276 kg (188 lb).Body mass index is 30.36 kg/(m^2).  See Admit Note MSE  COGNITIVE FEATURES THAT CONTRIBUTE TO RISK:  Closed-mindedness    SUICIDE RISK:   Moderate:  Frequent suicidal ideation with limited intensity, and duration, some specificity in terms of plans, no associated intent, good self-control, limited dysphoria/symptomatology, some risk factors present, and identifiable protective factors, including available and accessible social support.  PLAN OF CARE: Patient will be admitted to inpatient psychiatric unit for stabilization and safety. Will provide and encourage milieu participation. Provide medication management and maked adjustments as needed.  Will follow daily.    I certify that inpatient services furnished can reasonably be expected to improve the patient's condition.  Simon Aaberg, Felicita Gage 07/03/2014, 5:37 PM

## 2014-07-03 NOTE — Progress Notes (Signed)
Patient ID: Ashley Farmer, female   DOB: February 02, 1951, 63 y.o.   MRN: [id number] PER STATE REGULATIONS 482.30  THIS CHART WAS REVIEWED FOR MEDICAL NECESSITY WITH RESPECT TO THE PATIENT'S ADMISSION/DURATION OF STAY.  NEXT REVIEW DATE: 07/06/14. Roma Schanz, RN, BSN CASE MANAGER

## 2014-07-03 NOTE — BHH Suicide Risk Assessment (Signed)
[place] INPATIENT:  Family/Significant Other Suicide Prevention Education  Suicide Prevention Education:  Education Completed; son Tinesha Siegrist [phone] and sister Dot Lanes [phone],  (name of family member/significant other) has been identified by the patient as the family member/significant other with whom the patient will be residing, and identified as the person(s) who will aid the patient in the event of a mental health crisis (suicidal ideations/suicide attempt).  With written consent from the patient, the family member/significant other has been provided the following suicide prevention education, prior to the and/or following the discharge of the patient.  The suicide prevention education provided includes the following:  Suicide risk factors  Suicide prevention and interventions  National Suicide Hotline telephone number  [facility] assessment telephone number  [facility] Emergency Assistance [place] and/or Residential Mobile Crisis Unit telephone number  Request made of family/significant other to:  Remove weapons (e.g., guns, rifles, knives), all items previously/currently identified as safety concern.    Remove drugs/medications (over-the-counter, prescriptions, illicit drugs), all items previously/currently identified as a safety concern.  The family member/significant other verbalizes understanding of the suicide prevention education information provided.  The family member/significant other agrees to remove the items of safety concern listed above.  Johney Perotti, Casimiro Needle 07/03/2014, 3:37 PM

## 2014-07-03 NOTE — Progress Notes (Signed)
NSG shift assessment. 7a-7p.   D: This morning pt panic when she went to the cafeteria and had to return to the unit in a wheel chair because it caused her to feel light headed. She went back to bed. She rates her depression as an 8, with hopelessness being 8 and anxiety 8 on a scale of 0 to 10. Her goal today is to get her panic attack and anxiety under control and address the vertigo as it does not help her to feel good. The vertigo is chronic and ongoing. Meclizine helps some and the combination of Meclizine and Ativan helps. She plans to, "Try my best to take back control and think happy, positive thoughts."  Sometimes it is hard to be around anyone and she asks [place] to bear with her because, "I am dealing with some other health issues also."   A: Observed pt interacting in group and in the milieu: Support and encouragement offered. Safety maintained with observations every 15 minutes.   R:  Contracts for safety and continues to follow the treatment plan, working on learning new coping skills.

## 2014-07-03 NOTE — Progress Notes (Signed)
D    Pt isolated to her room this evening and requested medications be brought to her   She did not interact with anyone but her roommate   She appears depressed and anxious   She complained of dizziness and nausea  A   Verbal support given   Medications administered and effectiveness monitored   Q 15 min checks R   Pt safe at present

## 2014-07-03 NOTE — BHH Group Notes (Signed)
[facility] LCSW Group Therapy 07/03/2014 1:15 PM Type of Therapy: Group Therapy Participation Level: Active  Participation Quality: Attentive, Sharing and Supportive  Affect: Depressed and Flat  Cognitive: Alert and Oriented  Insight: Developing/Improving and Engaged  Engagement in Therapy: Developing/Improving and Engaged  Modes of Intervention: Activity, Clarification, Confrontation, Discussion, Education, Exploration, Limit-setting, Orientation, Problem-solving, Rapport Building, Art therapist, Socialization and Support  Summary of Progress/Problems: Patient was attentive and engaged with speaker from [facility]. Patient was attentive to speaker while they shared their story of dealing with mental health and overcoming it. Patient expressed interest in their programs and services and received information on their agency. Patient processed ways they can relate to the speaker.   Tilden Fossa, MSW, [place] Worker [facility] [phone]

## 2014-07-04 NOTE — Clinical Social Work Note (Signed)
CSW met with patient who reports that she is feeling unwell today and that her vertigo is bothering her. She states that she feels worse than yesterday. CSW spoke with patient regarding conversation with family who report that patient is to move to [facility] with her son. Patient confirms this but is unsure of when she will move. Patient agreeable to CSW providing information on outpatient services in both [place] and [place] for her to follow up on once she discharges.   Tilden Fossa, MSW, [place] Worker [facility] [phone]

## 2014-07-04 NOTE — Progress Notes (Signed)
D: Patient is alert and oriented. Pt's mood and affect depressed and blunted. Pt rates her depression 7/10, hopelessness and anxiety 8/10. Pt reports "my energy level and anxiety, the veritgo is making it hard to do much as I would like to." Pt reports her goal is to "try to move around more and not think negative." Pt's BP this am at 0730 was 170/49, pulse 51. Pt's BP decreased to 146/54 at 0930, pulse 60. Pt denies SI/HI and AVH at this time. A: PRN medications administered for dizziness per providers orders (See MAR). Scheduled Medications given per providers orders (See MAR). 15 minute checks completed per protocol for pt safety. R: Pt cooperative and receptive to nursing interventions.

## 2014-07-04 NOTE — [facility] Group Notes (Signed)
Adult Psychoeducational Group Note  Date:  07/04/2014 Time:  9:46 PM  Group Topic/Focus:  AA Meeting  Participation Level:  Did Not Attend  Participation Quality:  None  Affect:  None  Cognitive:  None  Insight: None  Engagement in Group:  None  Modes of Intervention:  Discussion and Education  Additional Comments:  Ashley Farmer did not attend group.  [name] A 07/04/2014, 9:46 PM

## 2014-07-04 NOTE — Progress Notes (Signed)
[facility] MD Progress Note  07/04/2014 4:17 PM Ashley Farmer  MRN:  [id number] Subjective:  Patient reports some mild improvement in her mood, but still feels depressed. She also reports some dizziness, nausea, which is chronic and which is partially alleviated by antivert. Denies vomiting, denies falls Objective:  I have discussed case with treatment team and have met with patient. Patient continues to present with depression and staff have noted her to exhibit a sad affect at times. She is , however, improving gradually, and today she is more conversant, and did smile at times appropriately during our session./ She denies any side effects at this time. She spoke about her chronic stressors, to include her lung malignancy , and also her sense of loneliness. She states she feels very lonely. She does seem future oriented and stated she was thinking of relocating to [place] to live with her son, and even stated she had considered registering on an online dating site. No disruptive behaviors on unit. Diagnosis:  MDD , Panic Disorder, Consider Depression secondary to chronic medical illness    Total Time spent with patient: 25 minutes   ADL's:  Partially improved   Sleep: improved   Appetite: fair   Suicidal Ideation:  Some passive thoughts of death, but at this time denies any suicidal plan or intent and contracts for safety on the unit Homicidal Ideation:  Denies  AEB (as evidenced by):  Psychiatric Specialty Exam: Physical Exam  Review of Systems  Constitutional: Negative for fever and chills.  Respiratory: Positive for cough. Negative for hemoptysis, shortness of breath and wheezing.   Cardiovascular: Negative for chest pain.  Gastrointestinal: Negative for vomiting.  Skin: Negative for rash.  Neurological: Positive for dizziness.       Chronic    Blood pressure 146/54, pulse 60, temperature 97.7 F (36.5 C), temperature source Oral, resp. rate 20, height 5' 6"  (1.676 m), weight  85.276 kg (188 lb).Body mass index is 30.36 kg/(m^2).  General Appearance: improved grooming  Eye Contact::  Good  Speech:  Slow  Volume:  Decreased  Mood:  Depressed  Affect:  Constricted, but more reactive than upon admission  Thought Process:  Goal Directed and Linear  Orientation:  Full (Time, Place, and Person)  Thought Content:  no hallucinations, no delusions  Suicidal Thoughts:  Yes.  without intent/plan at this time denies any thoughts of hurting self and  contracts for safety on unit   Homicidal Thoughts:  No  Memory: Recent and Remote grossly intact  Judgement:  Good  Insight:  Present  Psychomotor Activity:  Decreased  Concentration:  Good  Recall:  Good  Fund of Knowledge:Good  Language: Good  Akathisia:  Negative  Handed:  Right  AIMS (if indicated):     Assets:  Communication Skills Desire for Improvement Resilience  Sleep:  Number of Hours: 6.75   Musculoskeletal: Strength & Muscle Tone: within normal limits Gait & Station: normal Patient leans: N/A  Current Medications: Current Facility-Administered Medications  Medication Dose Route Frequency Provider Last Rate Last Dose  . estrogens (conjugated) (PREMARIN) tablet 0.3 mg  0.3 mg Oral QPM Shuvon Rankin, NP   0.3 mg at 07/03/14 1735  . ferrous sulfate tablet 325 mg  325 mg Oral Q breakfast Shuvon Rankin, NP   325 mg at 07/04/14 0849  . FLUoxetine (PROZAC) capsule 20 mg  20 mg Oral Daily Shuvon Rankin, NP   20 mg at 07/04/14 0849  . fluticasone (FLONASE) 50 MCG/ACT nasal spray 2 spray  2 spray Each Nare Daily PRN Shuvon Rankin, NP      . ibuprofen (ADVIL,MOTRIN) tablet 600 mg  600 mg Oral Q8H PRN Shuvon Rankin, NP      . LORazepam (ATIVAN) tablet 0.5 mg  0.5 mg Oral TID Shuvon Rankin, NP   0.5 mg at 07/04/14 1221  . magnesium hydroxide (MILK OF MAGNESIA) suspension 30 mL  30 mL Oral Daily PRN Shuvon Rankin, NP      . meclizine (ANTIVERT) tablet 25 mg  25 mg Oral TID PRN Shuvon Rankin, NP   25 mg at 07/04/14  1421  . mirtazapine (REMERON) tablet 15 mg  15 mg Oral QHS Shuvon Rankin, NP   15 mg at 07/03/14 2128  . nebivolol (BYSTOLIC) tablet 10 mg  10 mg Oral Daily Shuvon Rankin, NP   10 mg at 07/04/14 0849  . ondansetron (ZOFRAN) tablet 4 mg  4 mg Oral Q8H PRN Shuvon Rankin, NP      . ondansetron (ZOFRAN) tablet 4 mg  4 mg Oral Q6H Shuvon Rankin, NP   4 mg at 07/04/14 1421  . pantoprazole (PROTONIX) EC tablet 40 mg  40 mg Oral Daily Shuvon Rankin, NP   40 mg at 07/04/14 3403    Lab Results: No results found for this or any previous visit (from the past 48 hour(s)).  Physical Findings: AIMS: Facial and Oral Movements Muscles of Facial Expression: None, normal Lips and Perioral Area: None, normal Jaw: None, normal Tongue: None, normal,Extremity Movements Upper (arms, wrists, hands, fingers): None, normal Lower (legs, knees, ankles, toes): None, normal, Trunk Movements Neck, shoulders, hips: None, normal, Overall Severity Severity of abnormal movements (highest score from questions above): None, normal Incapacitation due to abnormal movements: None, normal Patient's awareness of abnormal movements (rate only patient's report): No Awareness, Dental Status Current problems with teeth and/or dentures?: Yes Does patient usually wear dentures?: Yes  CIWA:    COWS:     Assessment : Patient remains depressed, and presents with sad affect. At this time some passive thoughts of death, but no active SI and able to contract for safety- also presenting with a somewhat improved range of affect and seems more future oriented, for example stating she plans to relocate to [place] to be closer to her son. No medication side effects at this time. Treatment Plan Summary: Daily contact with patient to assess and evaluate symptoms and progress in treatment Medication management See below  Plan: Continue inpatient treatment , milieu, support Prozac 20 mgrs QAM Ativan 0.5 mgrs TID Remeron 15 mgrs  QHS Meclizine and Zofran on PRN basis for dizziness/nausea  Medical Decision Making Problem Points:  Established problem, stable/improving (1), Review of last therapy session (1) and Review of psycho-social stressors (1) Data Points:  Review of medication regiment & side effects (2)  I certify that inpatient services furnished can reasonably be expected to improve the patient's condition.   COBOS, [place] 07/04/2014, 4:17 PM

## 2014-07-04 NOTE — Tx Team (Signed)
Interdisciplinary Treatment Plan Update (Adult) Date: 07/04/2014   Time Reviewed: 9:30 AM  Progress in Treatment: Attending groups: Yes Participating in groups: Yes Taking medication as prescribed: Yes Tolerating medication: Yes Family/Significant other contact made: Yes, CSW has spoken with patient's son and sister Patient understands diagnosis: Yes Discussing patient identified problems/goals with staff: Yes Medical problems stabilized or resolved: Yes Denies suicidal/homicidal ideation: Treatment team continuing to assess Issues/concerns per patient self-inventory: Yes Other:  New problem(s) identified: N/A  Discharge Plan or Barriers: CSW continuing to assess. Patient agreeable to outpatient services but may be moving to [place] in the near future to live with son.  Reason for Continuation of Hospitalization:  Depression Anxiety Medication Stabilization   Comments: N/A  Estimated length of stay: 3-5 days  For review of initial/current patient goals, please see plan of care. Patient is a 63 year old African [place] female with a diagnosis of Generalized Anxiety Disorder with panic attacks and MDD, Single Episode, Moderate. Patient moved to [place] [facility] to live with her sister and brother-in-law 4 years ago after being diagnosed with lung cancer. Patient reports having a strong relationship with her family but feels that they don't understand how her medical and mental health issues are impacting her. Patient reports feeling increased depression and anxiety/panic attacks over the past 1.5 months was also around the anniversary of her significant others' death a year ago. Patient reports experiencing a fear of being home alone and was experiencing SI on admission. Patient was seeing a counselor through [facility] (per patient) named [name]. She would like a referral to therapy and medication management services. Her goals for treatment are to "get my medications right  and get my anxiety under control to where I can function." Patient will benefit from crisis stabilization, medication evaluation, group therapy, and psycho education in addition to case management for discharge planning. Patient and CSW reviewed pt's identified goals and treatment plan. Pt verbalized understanding and agreed to treatment plan.  Attendees: Patient:    Family:    Physician: Dr. Parke Poisson 07/04/2014 9:30 AM  Nursing: Eduard Roux, Charlyne Quale RN 07/04/2014 9:30 AM  Clinical Social Worker: Tilden Fossa,  LCSWA 07/04/2014 9:30 AM  Other: Joette Catching, LCSW 07/04/2014 9:30 AM  Other: Lucinda Dell, Beverly Sessions Liaison 07/04/2014 9:30 AM  Other:  Ermalinda Barrios, Pharmacist 07/04/2014 9:30 AM  Other:  07/04/2014 9:30 AM  Other:    Other:    Other:    Other:    Other:     Other:    Other:       Scribe for Treatment Team:  Tilden Fossa, MSW, [employer] [phone]

## 2014-07-04 NOTE — [facility] Group Notes (Signed)
[place] LCSW Group Therapy 07/04/2014 1:15 PM Type of Therapy: Group Therapy Participation Level: Active  Participation Quality: Attentive, Sharing and Supportive  Affect: Depressed and Flat  Cognitive: Alert and Oriented  Insight: Developing/Improving and Engaged  Engagement in Therapy: Developing/Improving and Engaged  Modes of Intervention: Clarification, Confrontation, Discussion, Education, Exploration, Limit-setting, Orientation, Problem-solving, Rapport Building, Art therapist, Socialization and Support  Summary of Progress/Problems: The topic for today was feelings about relapse. Pt discussed what relapse prevention is to them and identified triggers that they are on the path to relapse. Pt processed their feeling towards relapse and was able to relate to peers. Pt discussed coping skills that can be used for relapse prevention. Patient identified her relapse behavior as isolating herself. Patient discussed the possibility of moving to [place] with her son and her positive support system. CSW and other group members provided emotional support and encouragement.   Tilden Fossa, MSW, [place] Worker [facility] [phone]

## 2014-07-04 NOTE — [facility] Group Notes (Signed)
[place] Group Notes:  activities  Date:  07/04/2014  Time:  10:54 AM  Type of Therapy:  Psychoeducational Skills  Participation Level:  Active  Participation Quality:  Appropriate  Affect:  Appropriate  Cognitive:  Appropriate  Insight:  Appropriate  Engagement in Group:  Engaged  Modes of Intervention:  Discussion  Summary of Progress/Problems:  Delman Kitten 07/04/2014, 10:54 AM

## 2014-07-04 NOTE — [facility] Group Notes (Signed)
[place] LCSW Group Therapy 07/04/2014  1:15 PM   Type of Therapy: Group Therapy  Participation Level: Did Not Attend- patient in bed.  Tilden Fossa, MSW, [place] Worker [facility] [phone]

## 2014-07-05 DIAGNOSIS — R45851 Suicidal ideations: Secondary | ICD-10-CM

## 2014-07-05 DIAGNOSIS — F410 Panic disorder [episodic paroxysmal anxiety] without agoraphobia: Secondary | ICD-10-CM

## 2014-07-05 DIAGNOSIS — F329 Major depressive disorder, single episode, unspecified: Secondary | ICD-10-CM

## 2014-07-05 MED ORDER — LISINOPRIL 5 MG PO TABS
5.0000 mg | ORAL_TABLET | Freq: Every day | ORAL | Status: DC
  Administered 2014-07-06 – 2014-07-09 (×4): 5 mg via ORAL
  Filled 2014-07-05 (×7): qty 1

## 2014-07-05 NOTE — Progress Notes (Signed)
D- Patient is out in the milieu interacting with peers and attending groups.  C/O vertigo and nausea and she is compliant with scheduled medications.  Rates depression and hopelessness at 7 and anxiety at 8.  Denies SI and reports "low energy". A- Support and encouragement offered.  Continue current POC and evaluation of treatment goals.  Ongoing medication education. R- Safety maintained.

## 2014-07-05 NOTE — Progress Notes (Signed)
Adult Psychoeducational Group Note  Date:  07/05/2014 Time:  2:26 PM  Group Topic/Focus:  Therapeutic Activity   Participation Level:  Active  Participation Quality:  Appropriate  Affect:  Appropriate  Cognitive:  Appropriate  Insight: Appropriate  Engagement in Group:  Engaged and Supportive  Modes of Intervention:  Activity, Discussion, Socialization and Support   Elisha Headland 07/05/2014, 2:26 PM

## 2014-07-05 NOTE — [facility] Group Notes (Signed)
[facility] LCSW Group Therapy  07/05/2014 12:37 PM  Type of Therapy:  Group Therapy  Participation Level:  Did Not Attend   Christene Lye 07/05/2014, 12:37 PM

## 2014-07-05 NOTE — Progress Notes (Signed)
D: Pt denies SI/HI/AVH. Pt is pleasant and cooperative. Pt stated she wants to get her medications right. She would like to find something to help with the vertigo.   A: Pt was offered support and encouragement. Pt was given scheduled medications. Pt was encourage to attend groups. Q 15 minute checks were done for safety.   R:Pt receptive to treatment and safety maintained on unit.

## 2014-07-05 NOTE — Progress Notes (Signed)
D.  Pt pleasant on approach, remained in bed and did not attend evening group due to vertigo.  Denies SI/HI/hallucinations at this time.  Interacting appropriately with peers on unit.  A.  Support and encouragement offered  R.  Pt remains safe on unit, will continue to monitor.

## 2014-07-05 NOTE — Plan of Care (Signed)
Problem: Alteration in mood Goal: LTG-Patient reports reduction in suicidal thoughts (Patient reports reduction in suicidal thoughts and is able to verbalize a safety plan for whenever patient is feeling suicidal)  Outcome: Progressing Pt denies SI at this time

## 2014-07-05 NOTE — Progress Notes (Signed)
Patient ID: Ashley Farmer, female   DOB: Aug 02, 1951, 63 y.o.   MRN: [id number] [facility] MD Progress Note  07/05/2014 4:44 PM Ashley Farmer  MRN:  [id number] Subjective:   Patient states "I have been anxious today. The vertigo has been acting up. I think that makes my depression and anxiety worse. I am pretty depressed and anxious. My sleep has gotten better. I have some thoughts of death. I guess that is normal when a person is so medically sick. I would say my anxiety is an eight. It would be better if I did not have all these medical problems. I used to be someone who was totally independent and functional."  Objective:  Patient is seen and chart is reviewed. She appears depressed during follow up assessment today. The patient goes into detail about the devastation of being diagnosed with lung cancer stating "I never smoked or did anything I should not do." Nursing staff report that the patient has been visible on the unit and has been attending groups. Patient is also noted to be tending to her hygiene. She has been utilizing medications to help with complaints of dizziness. Denies any intent to harm self but feels very hopeless about her cancer diagnosis. Patient educated that Prozac can take several weeks to reach full therapeutic effect.   Diagnosis:  MDD , Panic Disorder, Consider Depression secondary to chronic medical illness   Total Time spent with patient: 25 minutes  ADL's:  Improving  Sleep: Good  Appetite: fair   Suicidal Ideation:  Some passive thoughts of death, but at this time denies any suicidal plan or intent and contracts for safety on the unit Homicidal Ideation:  Denies  AEB (as evidenced by):  Psychiatric Specialty Exam: Physical Exam  Review of Systems  Constitutional: Negative for fever, chills, weight loss, malaise/fatigue and diaphoresis.  HENT: Negative for congestion, ear discharge, ear pain, hearing loss, nosebleeds and tinnitus.   Respiratory: Positive for  cough. Negative for hemoptysis, shortness of breath, wheezing and stridor.   Cardiovascular: Negative for chest pain.  Gastrointestinal: Negative for heartburn, nausea, vomiting, abdominal pain, diarrhea and constipation.  Genitourinary: Negative for dysuria, urgency, frequency, hematuria and flank pain.  Musculoskeletal: Negative for myalgias, back pain, joint pain and neck pain.  Skin: Negative for itching and rash.  Neurological: Positive for dizziness. Negative for headaches.       Chronic  Endo/Heme/Allergies: Negative for environmental allergies and polydipsia. Does not bruise/bleed easily.  Psychiatric/Behavioral: Positive for depression and suicidal ideas. The patient is nervous/anxious.     Blood pressure 163/58, pulse 56, temperature 97.8 F (36.6 C), temperature source Oral, resp. rate 16, height 5\' 6"  (1.676 m), weight 85.276 kg (188 lb).Body mass index is 30.36 kg/(m^2).  General Appearance: Casual  Eye Contact::  Good  Speech:  Slow  Volume:  Decreased  Mood:  Depressed  Affect:  Sad  Thought Process:  Goal Directed and Linear  Orientation:  Full (Time, Place, and Person)  Thought Content:  no hallucinations, no delusions  Suicidal Thoughts:  Yes.  without intent/plan at this time denies any thoughts of hurting self and  contracts for safety on unit   Homicidal Thoughts:  No  Memory: Recent and Remote grossly intact  Judgement:  Good  Insight:  Present  Psychomotor Activity:  Decreased  Concentration:  Good  Recall:  Good  Fund of Knowledge:Good  Language: Good  Akathisia:  Negative  Handed:  Right  AIMS (if indicated):     Assets:  Communication  Skills Desire for Improvement Resilience  Sleep:  Number of Hours: 6.75   Musculoskeletal: Strength & Muscle Tone: within normal limits Gait & Station: normal Patient leans: N/A  Current Medications: Current Facility-Administered Medications  Medication Dose Route Frequency Provider Last Rate Last Dose  .  estrogens (conjugated) (PREMARIN) tablet 0.3 mg  0.3 mg Oral QPM Shuvon Rankin, NP   0.3 mg at 07/05/14 1614  . ferrous sulfate tablet 325 mg  325 mg Oral Q breakfast Shuvon Rankin, NP   325 mg at 07/05/14 0828  . FLUoxetine (PROZAC) capsule 20 mg  20 mg Oral Daily Shuvon Rankin, NP   20 mg at 07/05/14 0828  . fluticasone (FLONASE) 50 MCG/ACT nasal spray 2 spray  2 spray Each Nare Daily PRN Shuvon Rankin, NP      . ibuprofen (ADVIL,MOTRIN) tablet 600 mg  600 mg Oral Q8H PRN Shuvon Rankin, NP      . lisinopril (PRINIVIL,ZESTRIL) tablet 5 mg  5 mg Oral Daily Elmarie Shiley, NP      . LORazepam (ATIVAN) tablet 0.5 mg  0.5 mg Oral TID Shuvon Rankin, NP   0.5 mg at 07/05/14 1614  . magnesium hydroxide (MILK OF MAGNESIA) suspension 30 mL  30 mL Oral Daily PRN Shuvon Rankin, NP      . meclizine (ANTIVERT) tablet 25 mg  25 mg Oral TID PRN Shuvon Rankin, NP   25 mg at 07/04/14 1421  . mirtazapine (REMERON) tablet 15 mg  15 mg Oral QHS Shuvon Rankin, NP   15 mg at 07/04/14 2150  . nebivolol (BYSTOLIC) tablet 10 mg  10 mg Oral Daily Shuvon Rankin, NP   10 mg at 07/05/14 0828  . ondansetron (ZOFRAN) tablet 4 mg  4 mg Oral Q8H PRN Shuvon Rankin, NP      . ondansetron (ZOFRAN) tablet 4 mg  4 mg Oral Q6H Shuvon Rankin, NP   4 mg at 07/05/14 1614  . pantoprazole (PROTONIX) EC tablet 40 mg  40 mg Oral Daily Shuvon Rankin, NP   40 mg at 07/05/14 7209    Lab Results: No results found for this or any previous visit (from the past 48 hour(s)).  Physical Findings: AIMS: Facial and Oral Movements Muscles of Facial Expression: None, normal Lips and Perioral Area: None, normal Jaw: None, normal Tongue: None, normal,Extremity Movements Upper (arms, wrists, hands, fingers): None, normal Lower (legs, knees, ankles, toes): None, normal, Trunk Movements Neck, shoulders, hips: None, normal, Overall Severity Severity of abnormal movements (highest score from questions above): None, normal Incapacitation due to abnormal  movements: None, normal Patient's awareness of abnormal movements (rate only patient's report): No Awareness, Dental Status Current problems with teeth and/or dentures?: Yes Does patient usually wear dentures?: Yes  CIWA:    COWS:     Assessment : Treatment Plan Summary: Daily contact with patient to assess and evaluate symptoms and progress in treatment Medication management See below  Plan: 1. Continue inpatient treatment , milieu, support 2. Prozac 20 mgrs QAM for depression 3. Ativan 0.5 mgrs TID for anxiety 4. Remeron 15 mgrs QHS for insomnia 5. Meclizine and Zofran on PRN basis for dizziness/nausea 6. Address health issues: Continue Bystolic 10 mg daily for Hypertension. Will add Lisinopril 5 mg daily for elevated blood pressure. Will not increase Bystolic as pulse has been running in the mid 50's to 60's.   Medical Decision Making Problem Points:  Established problem, stable/improving (1), Review of last therapy session (1) and Review of psycho-social stressors (1) Data  Points:  Review of medication regiment & side effects (2)  I certify that inpatient services furnished can reasonably be expected to improve the patient's condition.   Dandra Shambaugh NP-C 07/05/2014, 4:44 PM

## 2014-07-06 DIAGNOSIS — F322 Major depressive disorder, single episode, severe without psychotic features: Principal | ICD-10-CM

## 2014-07-06 NOTE — [facility] Group Notes (Signed)
[place] Group Notes:  (Nursing/MHT/Case Management/Adjunct)  Date:  07/06/2014  Time:  9:00am  Type of Therapy:  Nurse Education  Participation Level:  Did Not Attend  Participation Quality:    Affect:      Cognitive:    Insight:     Engagement in Group:    Modes of Intervention:    Summary of Progress/Problems: Patient resting in bed due to dizziness. Did not attend.  Loletta Specter [facility] 07/06/2014, 10:59 AM

## 2014-07-06 NOTE — Progress Notes (Signed)
Patient resting in bed this morning. States she had no appetite for breakfast and is also experiencing a bout of her vertigo. Patient wishes to remain in bed. She is quite flat in affect and mood noticeably depressed. Medicated per orders with education provided. PRN meclizine given with mild relief. Patient supported and reassured. Fall precautions reviewed and strongly encouraged. Denies other physical problems, no pain. Refused to fill out self inventory at this time. She denies SI/HI and remains safe. Ashley Farmer

## 2014-07-06 NOTE — Progress Notes (Signed)
Patient ID: Ashley Farmer, female   DOB: 1951/03/06, 63 y.o.   MRN: [id number] Patient ID: Ashley Farmer, female   DOB: June 18, 1951, 63 y.o.   MRN: [id number] [facility] MD Progress Note  07/06/2014 1:24 PM Ashley Farmer  MRN:  [id number] Subjective:   Patient states "my mood is more down today and still anxious and dizzy too.  I slept ok but I don't have much of an appetite" Objective:  Patient is seen and chart is reviewed.  Although she appears depressed during follow up assessment today, patient is well groomed.  She states that her appearance has always been important to her.   Nursing staff report that the patient has been visible on the unit and has been attending groups.  She has been utilizing medications (meclizine) to help with complaints of dizziness. Denies any intent to harm self but feels very hopeless about her cancer diagnosis. Patient re- educated that Prozac can take several weeks to reach full therapeutic effect.   Diagnosis:  MDD , Panic Disorder, Consider Depression secondary to chronic medical illness   Total Time spent with patient: 25 minutes  ADL's:  Improving  Sleep: Good  Appetite: fair   Suicidal Ideation:  Some passive thoughts of death, but at this time denies any suicidal plan or intent and contracts for safety on the unit Homicidal Ideation:  Denies  AEB (as evidenced by):  Psychiatric Specialty Exam: Physical Exam  Review of Systems  Constitutional: Negative for fever, chills, weight loss, malaise/fatigue and diaphoresis.  HENT: Negative for congestion, ear discharge, ear pain, hearing loss, nosebleeds and tinnitus.   Respiratory: Positive for cough. Negative for hemoptysis, shortness of breath, wheezing and stridor.   Cardiovascular: Negative for chest pain.  Gastrointestinal: Negative for heartburn, nausea, vomiting, abdominal pain, diarrhea and constipation.  Genitourinary: Negative for dysuria, urgency, frequency, hematuria and flank pain.   Musculoskeletal: Negative for myalgias, back pain, joint pain and neck pain.  Skin: Negative for itching and rash.  Neurological: Positive for dizziness. Negative for headaches.       Chronic  Endo/Heme/Allergies: Negative for environmental allergies and polydipsia. Does not bruise/bleed easily.  Psychiatric/Behavioral: Positive for depression and suicidal ideas. The patient is nervous/anxious.     Blood pressure 150/81, pulse 67, temperature 98.6 F (37 C), temperature source Oral, resp. rate 16, height 5\' 6"  (1.676 m), weight 85.276 kg (188 lb).Body mass index is 30.36 kg/(m^2).  General Appearance: Casual  Eye Contact::  Good  Speech:  Slow  Volume:  Decreased  Mood:  Depressed  Affect:  Sad  Thought Process:  Goal Directed and Linear  Orientation:  Full (Time, Place, and Person)  Thought Content:  no hallucinations, no delusions  Suicidal Thoughts:  Yes.  without intent/plan at this time denies any thoughts of hurting self and  contracts for safety on unit   Homicidal Thoughts:  No  Memory: Recent and Remote grossly intact  Judgement:  Good  Insight:  Present  Psychomotor Activity:  Decreased  Concentration:  Good  Recall:  Good  Fund of Knowledge:Good  Language: Good  Akathisia:  Negative  Handed:  Right  AIMS (if indicated):     Assets:  Communication Skills Desire for Improvement Resilience  Sleep:  Number of Hours: 6.75   Musculoskeletal: Strength & Muscle Tone: within normal limits Gait & Station: normal Patient leans: N/A  Current Medications: Current Facility-Administered Medications  Medication Dose Route Frequency Provider Last Rate Last Dose  . estrogens (conjugated) (PREMARIN) tablet 0.3 mg  0.3  mg Oral QPM Shuvon Rankin, NP   0.3 mg at 07/05/14 1614  . ferrous sulfate tablet 325 mg  325 mg Oral Q breakfast Shuvon Rankin, NP   325 mg at 07/06/14 0835  . FLUoxetine (PROZAC) capsule 20 mg  20 mg Oral Daily Shuvon Rankin, NP   20 mg at 07/06/14 0835  .  fluticasone (FLONASE) 50 MCG/ACT nasal spray 2 spray  2 spray Each Nare Daily PRN Shuvon Rankin, NP      . ibuprofen (ADVIL,MOTRIN) tablet 600 mg  600 mg Oral Q8H PRN Shuvon Rankin, NP      . lisinopril (PRINIVIL,ZESTRIL) tablet 5 mg  5 mg Oral Daily Elmarie Shiley, NP   5 mg at 07/06/14 0834  . LORazepam (ATIVAN) tablet 0.5 mg  0.5 mg Oral TID Shuvon Rankin, NP   0.5 mg at 07/06/14 1155  . magnesium hydroxide (MILK OF MAGNESIA) suspension 30 mL  30 mL Oral Daily PRN Shuvon Rankin, NP      . meclizine (ANTIVERT) tablet 25 mg  25 mg Oral TID PRN Shuvon Rankin, NP   25 mg at 07/06/14 0836  . mirtazapine (REMERON) tablet 15 mg  15 mg Oral QHS Shuvon Rankin, NP   15 mg at 07/05/14 2132  . nebivolol (BYSTOLIC) tablet 10 mg  10 mg Oral Daily Shuvon Rankin, NP   10 mg at 07/06/14 0834  . ondansetron (ZOFRAN) tablet 4 mg  4 mg Oral Q8H PRN Shuvon Rankin, NP      . ondansetron (ZOFRAN) tablet 4 mg  4 mg Oral Q6H Shuvon Rankin, NP   4 mg at 07/06/14 0835  . pantoprazole (PROTONIX) EC tablet 40 mg  40 mg Oral Daily Shuvon Rankin, NP   40 mg at 07/06/14 4098    Lab Results: No results found for this or any previous visit (from the past 48 hour(s)).  Physical Findings: AIMS: Facial and Oral Movements Muscles of Facial Expression: None, normal Lips and Perioral Area: None, normal Jaw: None, normal Tongue: None, normal,Extremity Movements Upper (arms, wrists, hands, fingers): None, normal Lower (legs, knees, ankles, toes): None, normal, Trunk Movements Neck, shoulders, hips: None, normal, Overall Severity Severity of abnormal movements (highest score from questions above): None, normal Incapacitation due to abnormal movements: None, normal Patient's awareness of abnormal movements (rate only patient's report): No Awareness, Dental Status Current problems with teeth and/or dentures?: Yes Does patient usually wear dentures?: Yes  CIWA:    COWS:     Assessment : Treatment Plan Summary: Daily contact  with patient to assess and evaluate symptoms and progress in treatment Medication management See below  Plan: 1. Continue inpatient treatment , milieu, support 2. Prozac 20 mgrs QAM for depression 3. Ativan 0.5 mgrs TID for anxiety 4. Remeron 15 mgrs QHS for insomnia 5. Meclizine and Zofran on PRN basis for dizziness/nausea 6. Address health issues: Continue Bystolic 10 mg daily for Hypertension. Will add Lisinopril 5 mg daily for elevated blood pressure. Will not increase Bystolic as pulse has been running in the mid 50's to 60's.   Medical Decision Making Problem Points:  Established problem, stable/improving (1), Review of last therapy session (1) and Review of psycho-social stressors (1) Data Points:  Review of medication regiment & side effects (2)  I certify that inpatient services furnished can reasonably be expected to improve the patient's condition.   Thuy Atilano AGNP-BC 07/06/2014, 1:24 PM

## 2014-07-06 NOTE — Plan of Care (Signed)
Problem: Ineffective individual coping Goal: STG: Patient will remain free from self harm Outcome: Progressing Patient has not engaged in self harm and currently denies SI.  Problem: Diagnosis: Increased Risk For Suicide Attempt Goal: STG-Patient Will Comply With Medication Regime Outcome: Progressing Patient is med compliant.

## 2014-07-06 NOTE — Progress Notes (Signed)
Patient ID: Ashley Farmer, female   DOB: 12-27-1950, 63 y.o.   MRN: [id number]  [facility] Group Notes:  (Nursing/MHT/Case Management/Adjunct)  Date:  07/06/2014  Time:  3:29 PM  Type of Therapy:  Nurse Education  Participation Level:  Did Not Attend  Participation Quality:  Did not attend  Affect:  Did not attend  Cognitive:  Did not attend  Insight:  None  Engagement in Group:  None  Modes of Intervention:  Discussion and Education  Summary of Progress/Problems: The purpose of this group is to follow up on earlier expressed concerns and rules. Patient was invited but did not attend.

## 2014-07-06 NOTE — Progress Notes (Signed)
Pt did not attend NA group this evening.  

## 2014-07-06 NOTE — [facility] Group Notes (Signed)
[facility] LCSW Group Therapy  07/06/2014 2:55 PM  Type of Therapy:  Group Therapy  Participation Level:  Did Not Attend   Christene Lye 07/06/2014, 2:55 PM

## 2014-07-06 NOTE — Progress Notes (Signed)
Pt has remained primarily in bed throughout the day. Has not attended any groups. Continues to complain of a more depressed mood today as well as increased anxiety and dizziness. Reminded of fall precautions. Meclizine offered but patient declined indicating she would like to wait until closer to bed time. Patient remains safe at present. Jamie Kato

## 2014-07-07 MED ORDER — MIRTAZAPINE 30 MG PO TABS
30.0000 mg | ORAL_TABLET | Freq: Every day | ORAL | Status: DC
  Administered 2014-07-07: 15 mg via ORAL
  Administered 2014-07-08: 30 mg via ORAL
  Filled 2014-07-07 (×4): qty 1

## 2014-07-07 NOTE — B[facility] Group Notes (Signed)
Adult Psychoeducational Group Note  Date:  07/07/2014 Time:  10:22 PM  Group Topic/Focus:  AA Meeting  Participation Level:  Did Not Attend  Participation Quality:  None  Affect:  None  Cognitive:  None  Insight: None  Engagement in Group:  None  Modes of Intervention:  Discussion and Education  Additional Comments:  Ashley Farmer did not attend group.  [name] A 07/07/2014, 10:22 PM

## 2014-07-07 NOTE — Progress Notes (Signed)
Patient ID: Ashley Farmer, female   DOB: Dec 23, 1950, 63 y.o.   MRN: [id number]  PER STATE REGULATIONS 482.30  THIS CHART WAS REVIEWED FOR MEDICAL NECESSITY WITH RESPECT TO THE PATIENT'S ADMISSION/ DURATION OF STAY.  NEXT REVIEW DATE: 07/10/2014  Chauncy Lean, RN, BSN CASE MANAGER

## 2014-07-07 NOTE — Progress Notes (Signed)
D   Pt isolates to her room most of the time   She has been visible on the milieu and watches tv in the dayroom but did not attend this evenings group   She continues to complain of dizziness and nausea   She appears depressed and anxious   Her interactions with others is minimal A   Verbal support given   Medications administered and effectiveness monitored   Q 15 min checks R   Pt safe at present and receptive to verbal support

## 2014-07-07 NOTE — Clinical Social Work Note (Signed)
CSW met with patient who reports that she is feeling unwell today physically and emotionally. Patient continues to report experiencing vertigo and depression. Patient endorses some passive SI, denies HI but can contract for safety. Patient reports that she continues to be unsure of the details regarding her move to [facility] to be with her son. CSW provided patient with listing of outpatient therapy/medication management services in [place] as well as [place] for patient to follow up with once she discharges. Patient agreeable and has no further questions at this time. CSW encouraged patient to attend afternoon therapy group if she is able.   Tilden Fossa, MSW, [place] Worker [facility] [phone]

## 2014-07-07 NOTE — B[facility] Group Notes (Signed)
[place] LCSW Group Therapy 07/07/2014  1:15 PM   Type of Therapy: Group Therapy  Participation Level: Did Not Attend- patient in bed.   Tilden Fossa, MSW, [place] Worker [facility] [phone]

## 2014-07-07 NOTE — Progress Notes (Signed)
Patient ID: Ashley Farmer, female   DOB: 06-03-1951, 63 y.o.   MRN: [id number] She has been in bed today reading a book. She said that she was still having vertigo and she did not want to get up. Said that she would call for help if she needed it . When asked she did say that she had been up to the restroom.  Self Inventory : depression and hopelessness at 7, anxiety 7 , indicated she was coughing on inventory none noted by me and she did not verbally c/o cough.  Her goal:  Nothing because nothing has changed. Regardless of what I try , still feeling bad unhappy.

## 2014-07-07 NOTE — [facility] Group Notes (Signed)
[facility] LCSW Aftercare Discharge Planning Group Note  07/07/2014  8:45 AM  Participation Quality: Did Not Attend- patient in bed.  Tilden Fossa, MSW, [place] Worker [facility] [phone]

## 2014-07-07 NOTE — Progress Notes (Signed)
Patient ID: Ashley Farmer, female   DOB: 1950-09-11, 63 y.o.   MRN: [id number] [facility] MD Progress Note  07/07/2014 1:05 PM Ashley Farmer  MRN:  [id number] Subjective:   Patient reports some improvement but overall remains depressed. She reports chronic nausea and vertigo type symptoms. There has been no vomiting. Objective:  I have discussed case with treatment team and have met with patient. Patient has remained depressed, but there has been some degree of improvement compared to her admission status. For example, she is smiling more, and today even laughed appropriately during our session. She also seems more future oriented, and states that she is thinking of relocating to [place] with her son, and to be closet to a niece she is fond of . She is debating whether to relocate and if so, when. She does describe chronic symptoms of dizziness and nausea which she attributes to her neuro-endocrine tumor. These symptoms are chronic and not temporally associated or aggravated by her psychiatric medications.  She does respond to symptomatic relief with She has not fallen. With patient's express consent I have spoken with Dr. Ammie Dalton, who is her oncologist. Dr. Ammie Dalton  Will see her soon after discharge for further work up if needed. He also stated that since her tumor has not progressed over recent years since it was originally diagnosed, this significantly improves patient's prognosis. Denies medication side effects  Diagnosis:  MDD , Panic Disorder, Consider Depression secondary to chronic medical illness   Total Time spent with patient: 25 minutes  ADL's:  Improving  Sleep: Good  Appetite: fair   Suicidal Ideation:  Denies SI at this time Homicidal Ideation:  Denies  AEB (as evidenced by):  Psychiatric Specialty Exam: Physical Exam  Review of Systems  Constitutional: Negative for fever, chills, weight loss, malaise/fatigue and diaphoresis.  HENT: Negative for congestion, ear discharge, ear  pain, hearing loss, nosebleeds and tinnitus.   Respiratory: Positive for cough. Negative for hemoptysis, shortness of breath, wheezing and stridor.   Cardiovascular: Negative for chest pain.  Gastrointestinal: Negative for heartburn, nausea, vomiting, abdominal pain, diarrhea and constipation.  Genitourinary: Negative for dysuria, urgency, frequency, hematuria and flank pain.  Musculoskeletal: Negative for myalgias, back pain, joint pain and neck pain.  Skin: Negative for itching and rash.  Neurological: Positive for dizziness. Negative for headaches.       Chronic  Endo/Heme/Allergies: Negative for environmental allergies and polydipsia. Does not bruise/bleed easily.  Psychiatric/Behavioral: Positive for depression and suicidal ideas. The patient is nervous/anxious.     Blood pressure 164/58, pulse 69, temperature 98 F (36.7 C), temperature source Oral, resp. rate 18, height _0  (1.676 m), weight 85.276 kg (188 lb).Body mass index is 30.36 kg/(m^2).  General Appearance: improved  Eye Contact::  Good  Speech:  Normal Rate  Volume:  Normal  Mood:  Depressed, but improved compared to her initial presentation   Affect:  Constricted but more reactive   Thought Process:  Goal Directed and Linear  Orientation:  Full (Time, Place, and Person)  Thought Content:  no hallucinations, no delusions  Suicidal Thoughts:  No at this time denies any thoughts of hurting self and  contracts for safety on unit   Homicidal Thoughts:  No  Memory: Recent and Remote grossly intact  Judgement:  Good  Insight:  Present  Psychomotor Activity:  Decreased  Concentration:  Good  Recall:  Good  Fund of Knowledge:Good  Language: Good  Akathisia:  Negative  Handed:  Right  AIMS (if indicated):  Assets:  Communication Skills Desire for Improvement Resilience  Sleep:  Number of Hours: 6.75   Musculoskeletal: Strength & Muscle Tone: within normal limits Gait & Station: normal Patient leans:  N/A  Current Medications: Current Facility-Administered Medications  Medication Dose Route Frequency Provider Last Rate Last Dose  . estrogens (conjugated) (PREMARIN) tablet 0.3 mg  0.3 mg Oral QPM Shuvon Rankin, NP   0.3 mg at 07/06/14 1714  . ferrous sulfate tablet 325 mg  325 mg Oral Q breakfast Shuvon Rankin, NP   325 mg at 07/07/14 0836  . FLUoxetine (PROZAC) capsule 20 mg  20 mg Oral Daily Shuvon Rankin, NP   20 mg at 07/07/14 0836  . fluticasone (FLONASE) 50 MCG/ACT nasal spray 2 spray  2 spray Each Nare Daily PRN Shuvon Rankin, NP      . ibuprofen (ADVIL,MOTRIN) tablet 600 mg  600 mg Oral Q8H PRN Shuvon Rankin, NP      . lisinopril (PRINIVIL,ZESTRIL) tablet 5 mg  5 mg Oral Daily Elmarie Shiley, NP   5 mg at 07/07/14 0835  . LORazepam (ATIVAN) tablet 0.5 mg  0.5 mg Oral TID Shuvon Rankin, NP   0.5 mg at 07/07/14 1221  . magnesium hydroxide (MILK OF MAGNESIA) suspension 30 mL  30 mL Oral Daily PRN Shuvon Rankin, NP      . meclizine (ANTIVERT) tablet 25 mg  25 mg Oral TID PRN Shuvon Rankin, NP   25 mg at 07/07/14 0615  . mirtazapine (REMERON) tablet 15 mg  15 mg Oral QHS Shuvon Rankin, NP   15 mg at 07/06/14 2158  . nebivolol (BYSTOLIC) tablet 10 mg  10 mg Oral Daily Shuvon Rankin, NP   10 mg at 07/07/14 0835  . ondansetron (ZOFRAN) tablet 4 mg  4 mg Oral Q8H PRN Shuvon Rankin, NP      . ondansetron (ZOFRAN) tablet 4 mg  4 mg Oral Q6H Shuvon Rankin, NP   4 mg at 07/07/14 0837  . pantoprazole (PROTONIX) EC tablet 40 mg  40 mg Oral Daily Shuvon Rankin, NP   40 mg at 07/07/14 9030    Lab Results: No results found for this or any previous visit (from the past 48 hour(s)).  Physical Findings: AIMS: Facial and Oral Movements Muscles of Facial Expression: None, normal Lips and Perioral Area: None, normal Jaw: None, normal Tongue: None, normal,Extremity Movements Upper (arms, wrists, hands, fingers): None, normal Lower (legs, knees, ankles, toes): None, normal, Trunk Movements Neck,  shoulders, hips: None, normal, Overall Severity Severity of abnormal movements (highest score from questions above): None, normal Incapacitation due to abnormal movements: None, normal Patient's awareness of abnormal movements (rate only patient's report): No Awareness, Dental Status Current problems with teeth and/or dentures?: Yes Does patient usually wear dentures?: Yes  CIWA:    COWS:     Assessment : Patient remains depressed , but improved compared to admission and at this time not suicidal . She is also noted to be more future oriented. Chronic nausea, which patient attributes to her underlying malignancy, contributes to depression but responds to Zofran and Antivert.  Treatment Plan Summary: Daily contact with patient to assess and evaluate symptoms and progress in treatment Medication management See below  Plan: 1. Continue inpatient treatment , milieu, support 2. Prozac 20 mgrs QAM  3. Ativan 0.5 mgrs TID  4. Increase Remeron  To 30 mgrs QHS 5. Meclizine and Zofran on PRN basis for dizziness/nausea   Medical Decision Making Problem Points:  Established problem, stable/improving (1),  Review of last therapy session (1) and Review of psycho-social stressors (1) Data Points:  Review of medication regiment & side effects (2) Review of new medications or change in dosage (2)  I certify that inpatient services furnished can reasonably be expected to improve the patient's condition.   Syed Zukas, [place]  07/07/2014, 1:05 PM

## 2014-07-07 NOTE — Progress Notes (Signed)
Patient ID: Ashley Farmer, female   DOB: 05-24-1951, 63 y.o.   MRN: [id number] She has been discharged and was picked up by her father. She voiced understanding of discharge teaching about medications and follow up care plan. Self inventory this AM : depression , hopelessness,a and anxiety 0' s. Denies withdrawal  And SI thoughts. C/O headaches did not request any PRN medications. She took all of her belongs home with her.

## 2014-07-08 MED ORDER — FLUOXETINE HCL 20 MG PO CAPS
40.0000 mg | ORAL_CAPSULE | Freq: Every day | ORAL | Status: DC
  Administered 2014-07-09 – 2014-07-16 (×8): 40 mg via ORAL
  Filled 2014-07-08 (×8): qty 2
  Filled 2014-07-08: qty 8
  Filled 2014-07-08: qty 2

## 2014-07-08 NOTE — Progress Notes (Signed)
Patient ID: Ashley Farmer, female   DOB: 05-Feb-1951, 63 y.o.   MRN: [id number] [facility] MD Progress Note  07/08/2014 12:16 PM [name]  MRN:  [id number] Subjective:   Patient reports she feels about the same. She is ruminative about what she will do when she is discharged.  Objective:  I have discussed case with treatment team and have met with patient. Patient remains depressed, although there has been some partial improvement compared to admission. She remains ruminative about her post discharge options, which include staying with sister in [place] area or moving to [place] to live with son. She tends to focus on negative aspects of both of these options, such as loneliness if she stays in [place], and loss of contacts/friends she has here if she relocates to [place].  She denies any current suicidal ideations. As noted, patient has been diagnosed with advanced malignancy , but as confirmed with oncologist, her particular case has been fortunate and tumor has not grown significantly since its diagnosis back in 2011. Patient does continue, however, to ruminate about a sense of shortened future. She has chronic vertigo/nausea- states this has been worked up before and states she was diagnosed with positional vertigo in the past via a tilt test. Antivert and Zofran help. She does not fall and her gait is steady. We discussed the importance of making efforts to avoid isolation. Patient states that prior to admission she was staying in her room /home " all day", and had little interactions with others, other than her immediate family. Patient does continue to spend significant time in her room. Some group participation. Tolerating medications well, denies side effects. No full blown panic attacks on unit, but has had episodes of increased subjective anxiety.   Diagnosis:  MDD , Panic Disorder, Consider Depression secondary to chronic medical illness   Total Time spent with patient: 25  minutes  ADL's:  Improving  Sleep: Good  Appetite: fair   Suicidal Ideation:  Denies SI at this time Homicidal Ideation:  Denies  AEB (as evidenced by):  Psychiatric Specialty Exam: Physical Exam  Review of Systems  Constitutional: Negative for fever, chills, weight loss, malaise/fatigue and diaphoresis.  HENT: Negative for congestion, ear discharge, ear pain, hearing loss, nosebleeds and tinnitus.   Respiratory: Positive for cough. Negative for hemoptysis, shortness of breath, wheezing and stridor.   Cardiovascular: Negative for chest pain.  Gastrointestinal: Negative for heartburn, nausea, vomiting, abdominal pain, diarrhea and constipation.  Genitourinary: Negative for dysuria, urgency, frequency, hematuria and flank pain.  Musculoskeletal: Negative for myalgias, back pain, joint pain and neck pain.  Skin: Negative for itching and rash.  Neurological: Positive for dizziness. Negative for headaches.       Chronic  Endo/Heme/Allergies: Negative for environmental allergies and polydipsia. Does not bruise/bleed easily.  Psychiatric/Behavioral: Positive for depression and suicidal ideas. The patient is nervous/anxious.     Blood pressure 163/73, pulse 66, temperature 98.2 F (36.8 C), temperature source Oral, resp. rate 16, height $RemoveBe'5\' 6"'fhtRWpFQU$  (1.676 m), weight 85.276 kg (188 lb).Body mass index is 30.36 kg/(m^2).  General Appearance: improved  Eye Contact::  Good  Speech:  Normal Rate  Volume:  Normal  Mood:  Depressed   Affect:  Constricted   Thought Process:  Goal Directed and Linear  Orientation:  Full (Time, Place, and Person)  Thought Content:  no hallucinations, no delusions  Suicidal Thoughts:  No at this time denies any thoughts of hurting self and  contracts for safety on unit   Homicidal  Thoughts:  No  Memory: Recent and Remote grossly intact  Judgement:  Good  Insight:  Present  Psychomotor Activity:  Decreased  Concentration:  Good  Recall:  Good  Fund of  Knowledge:Good  Language: Good  Akathisia:  Negative  Handed:  Right  AIMS (if indicated):     Assets:  Communication Skills Desire for Improvement Resilience  Sleep:  Number of Hours: 5.75   Musculoskeletal: Strength & Muscle Tone: within normal limits Gait & Station: normal Patient leans: N/A  Current Medications: Current Facility-Administered Medications  Medication Dose Route Frequency Provider Last Rate Last Dose  . estrogens (conjugated) (PREMARIN) tablet 0.3 mg  0.3 mg Oral QPM Shuvon Rankin, NP   0.3 mg at 07/07/14 1800  . ferrous sulfate tablet 325 mg  325 mg Oral Q breakfast Shuvon Rankin, NP   325 mg at 07/08/14 0751  . FLUoxetine (PROZAC) capsule 20 mg  20 mg Oral Daily Shuvon Rankin, NP   20 mg at 07/08/14 0752  . fluticasone (FLONASE) 50 MCG/ACT nasal spray 2 spray  2 spray Each Nare Daily PRN Shuvon Rankin, NP      . ibuprofen (ADVIL,MOTRIN) tablet 600 mg  600 mg Oral Q8H PRN Shuvon Rankin, NP      . lisinopril (PRINIVIL,ZESTRIL) tablet 5 mg  5 mg Oral Daily Fransisca Kaufmann, NP   5 mg at 07/08/14 0751  . LORazepam (ATIVAN) tablet 0.5 mg  0.5 mg Oral TID Shuvon Rankin, NP   0.5 mg at 07/08/14 1204  . magnesium hydroxide (MILK OF MAGNESIA) suspension 30 mL  30 mL Oral Daily PRN Shuvon Rankin, NP      . meclizine (ANTIVERT) tablet 25 mg  25 mg Oral TID PRN Shuvon Rankin, NP   25 mg at 07/07/14 0615  . mirtazapine (REMERON) tablet 30 mg  30 mg Oral QHS Rockey Situ Cobos, MD   15 mg at 07/07/14 2157  . nebivolol (BYSTOLIC) tablet 10 mg  10 mg Oral Daily Shuvon Rankin, NP   10 mg at 07/08/14 0751  . ondansetron (ZOFRAN) tablet 4 mg  4 mg Oral Q8H PRN Shuvon Rankin, NP      . ondansetron (ZOFRAN) tablet 4 mg  4 mg Oral Q6H Shuvon Rankin, NP   4 mg at 07/08/14 0753  . pantoprazole (PROTONIX) EC tablet 40 mg  40 mg Oral Daily Shuvon Rankin, NP   40 mg at 07/08/14 4643    Lab Results: No results found for this or any previous visit (from the past 48 hour(s)).  Physical  Findings: AIMS: Facial and Oral Movements Muscles of Facial Expression: None, normal Lips and Perioral Area: None, normal Jaw: None, normal Tongue: None, normal,Extremity Movements Upper (arms, wrists, hands, fingers): None, normal Lower (legs, knees, ankles, toes): None, normal, Trunk Movements Neck, shoulders, hips: None, normal, Overall Severity Severity of abnormal movements (highest score from questions above): None, normal Incapacitation due to abnormal movements: None, normal Patient's awareness of abnormal movements (rate only patient's report): No Awareness, Dental Status Current problems with teeth and/or dentures?: Yes Does patient usually wear dentures?: Yes  CIWA:    COWS:     Assessment : Remains depressed, sad,tends to isolate. Tends to frame issues , such as her options after discharge, as  Depressing and negative. Denies suicidal ideations. No medication side effects.  Treatment Plan Summary: Daily contact with patient to assess and evaluate symptoms and progress in treatment Medication management See below  Plan: 1. Continue inpatient treatment , milieu, support 2.  Increase Prozac to 40  mgrs QAM  3. Ativan 0.5 mgrs TID  4.Remeron  30 mgrs QHS 5. Meclizine and Zofran on PRN basis for dizziness/nausea   Medical Decision Making Problem Points:  Established problem, stable/improving (1), Review of last therapy session (1) and Review of psycho-social stressors (1) Data Points:  Review of medication regiment & side effects (2) Review of new medications or change in dosage (2)  I certify that inpatient services furnished can reasonably be expected to improve the patient's condition.   COBOS, [place]  07/08/2014, 12:16 PM

## 2014-07-08 NOTE — Progress Notes (Signed)
Recreation Therapy Notes   Animal-Assisted Activity/Therapy (AAA/T) Program Checklist/Progress Notes Patient Eligibility Criteria Checklist & Daily Group note for Rec Tx Intervention  Date: 11.24.2015 Time: 2:45pm Location: 25 Film/video editor   AAA/T Program Assumption of Risk Form signed by Patient/ or Parent Legal Guardian yes  Patient is free of allergies or sever asthma yes  Patient reports no fear of animals yes  Patient reports no history of cruelty to animals yes   Patient understands his/her participation is voluntary yes  Behavioral Response: Did not attend.   Laureen Ochs Asuncion Tapscott, LRT/CTRS   Trigg Delarocha L 07/08/2014 4:25 PM

## 2014-07-08 NOTE — Progress Notes (Signed)
D: Patient presents with depressed affect and mood. She reported on the self inventory sheet that she's sleeping fair, poor appetite and ability to concentrate and low energy level. Patient rated depression "7" and feelings of hopelessness/anxiety "8". She's participating in groups and visible in the milieu, but reported that she needs help coping. Patient adheres to current medication regimen.  A: Support and encouragement provided to patient. Administered scheduled medications per ordering MD. Monitor Q15 minute checks for safety.  R: Patient receptive. Denies SI/HI and AVH. Patient remains safe on the unit.

## 2014-07-08 NOTE — Tx Team (Signed)
Interdisciplinary Treatment Plan Update (Adult) Date: 07/08/2014   Time Reviewed: 9:30 AM  Progress in Treatment: Attending groups: Minimally, patient reports feeling unwell due to vertigo Participating in groups: Yes, when she attends Taking medication as prescribed: Yes Tolerating medication: Yes Family/Significant other contact made: Yes, CSW has spoken with patient's son and sister Patient understands diagnosis: Yes Discussing patient identified problems/goals with staff: Yes Medical problems stabilized or resolved: Yes Denies suicidal/homicidal ideation: Treatment team continuing to assess Issues/concerns per patient self-inventory: Yes Other:  New problem(s) identified: N/A  Discharge Plan or Barriers: CSW continuing to assess. Patient agreeable to outpatient services but may be moving to [place] in the near future to live with son. List of outpatient resources for [place] and [place] provided to patient.  Reason for Continuation of Hospitalization:  Depression Anxiety Medication Stabilization   Comments: N/A  Estimated length of stay: 1-2 days  For review of initial/current patient goals, please see plan of care. Patient is a 63 year old African [place] female with a diagnosis of Generalized Anxiety Disorder with panic attacks and MDD, Single Episode, Moderate. Patient moved to [facility] to live with her sister and brother-in-law 4 years ago after being diagnosed with lung cancer. Patient reports having a strong relationship with her family but feels that they don't understand how her medical and mental health issues are impacting her. Patient reports feeling increased depression and anxiety/panic attacks over the past 1.5 months was also around the anniversary of her significant others' death a year ago. Patient reports experiencing a fear of being home alone and was experiencing SI on admission. Patient was seeing a counselor through [facility] (per patient) named [name]. She would like a referral to therapy and medication management services. Her goals for treatment are to "get my medications right and get my anxiety under control to where I can function." Patient will benefit from crisis stabilization, medication evaluation, group therapy, and psycho education in addition to case management for discharge planning. Patient and CSW reviewed pt's identified goals and treatment plan. Pt verbalized understanding and agreed to treatment plan.  Attendees: Patient:    Family:    Physician: Dr. Parke Poisson; Dr. Sabra Heck 07/08/2014 9:30 AM  Nursing: Gaylan Gerold, Tobey Bride RN 07/08/2014 9:30 AM  Clinical Social Worker: Erasmo Downer Kamsiyochukwu Spickler,  LCSWA 07/08/2014 9:30 AM  Other: Joette Catching, LCSW 07/08/2014 9:30 AM  Other: Lucinda Dell, [place] Sessions Liaison 07/08/2014 9:30 AM  Other: Lars Pinks, Case Manager 07/08/2014 9:30 AM  Other:   07/08/2014 9:30 AM  Other:    Other:    Other:    Other:    Other:      Other:    Other:       Scribe for Treatment Team:  Tilden Fossa, MSW, [employer] [phone]

## 2014-07-08 NOTE — BHH Group Notes (Signed)
Adult Psychoeducational Group Note  Date:  07/08/2014 Time:  11:22 PM  Group Topic/Focus:  AA Meeting  Participation Level:  Did Not Attend  Participation Quality:  None  Affect:  None  Cognitive:  None  Insight: None  Engagement in Group:  None  Modes of Intervention:  Discussion and Education  Additional Comments:  Rossetta did not attend group.  [name] A 07/08/2014, 11:22 PM

## 2014-07-08 NOTE — Progress Notes (Signed)
Pt attended spiritual care group on grief and loss facilitated by Counseling intern Martinique Austin and chaplain Jerene Pitch. Group opened with brief discussion and psycho-social ed around grief and loss in relationships and in relation to self - identifying life patterns, circumstances, changes that cause losses. Established group norm of speaking from own life experience. Group goal of establishing open and affirming space for members to share loss and experience with grief, normalize grief experience and provide psycho social education and grief support.  Fariha was pulled from group early in session by NP.  She did not return to group.   [place], [place]

## 2014-07-08 NOTE — Progress Notes (Signed)
D   Pt isolates to her room most of the time   She has been visible on the milieu and watches tv in the dayroom but did not attend this evenings group   She continues to complain of dizziness and nausea   She appears depressed and anxious   Her interactions with others is minimal  She was a little more talkative this evening and she said she got a full nights sleep without waking up  A   Verbal support given   Medications administered and effectiveness monitored   Q 15 min checks R   Pt safe at present and receptive to verbal support

## 2014-07-08 NOTE — BHH Group Notes (Signed)
[place] LCSW Group Therapy  07/08/2014   1:15 PM   Type of Therapy:  Group Therapy  Participation Level:  Active  Participation Quality:  Attentive, Sharing and Supportive  Affect:  Depressed and Flat  Cognitive:  Alert and Oriented  Insight:  Developing/Improving and Engaged  Engagement in Therapy:  Developing/Improving and Engaged  Modes of Intervention:  Clarification, Confrontation, Discussion, Education, Exploration, Limit-setting, Orientation, Problem-solving, Rapport Building, Art therapist, Socialization and Support  Summary of Progress/Problems: The topic for group therapy was feelings about diagnosis.  Pt actively participated in group discussion on their past and current diagnosis and how they feel towards this.  Pt also identified how society and family members judge them, based on their diagnosis as well as stereotypes and stigmas.  Patient reports that her diagnosis has "opened new doors" in the sense that she has had time to reflect on her life and perceived loss of self. Patient reports that she had given up on life and wants to make changes moving forward. CSW's and other group members provided emotional support and encouragement.  Tilden Fossa, MSW, [place] Worker [facility] [phone]

## 2014-07-08 NOTE — Progress Notes (Signed)
Patient ID: Ashley Farmer, female   DOB: 12/25/1950, 63 y.o.   MRN: [id number]  [facility] Group Notes:  (Nursing/MHT/Case Management/Adjunct)  Date:  07/08/2014  Time:  10:32 AM  Type of Therapy:  Nurse Education  Participation Level:  Did Not Attend  Participation Quality:  Did not attend  Affect:  Did not attend  Cognitive:  Did not attend  Insight:  None  Engagement in Group:  None  Modes of Intervention:  Discussion and Education  Summary of Progress/Problems: The purpose of this group is to discuss the topic of the day which is Recovery. Writer speaks to the "road to recovery" and using coping skills and support systems to continue of the road to recovery. Patient was invited to group but did not attend.

## 2014-07-09 ENCOUNTER — Other Ambulatory Visit: Payer: Self-pay | Admitting: Internal Medicine

## 2014-07-09 MED ORDER — LISINOPRIL 10 MG PO TABS
10.0000 mg | ORAL_TABLET | Freq: Every day | ORAL | Status: DC
  Administered 2014-07-10 – 2014-07-16 (×7): 10 mg via ORAL
  Filled 2014-07-09 (×8): qty 1
  Filled 2014-07-09: qty 4

## 2014-07-09 MED ORDER — MIRTAZAPINE 45 MG PO TABS
45.0000 mg | ORAL_TABLET | Freq: Every day | ORAL | Status: DC
  Administered 2014-07-09: 45 mg via ORAL
  Filled 2014-07-09 (×3): qty 1

## 2014-07-09 MED ORDER — LISINOPRIL 5 MG PO TABS
5.0000 mg | ORAL_TABLET | Freq: Once | ORAL | Status: AC
  Administered 2014-07-09: 5 mg via ORAL
  Filled 2014-07-09 (×2): qty 1

## 2014-07-09 NOTE — BHH Group Notes (Signed)
[place] LCSW Group Therapy 07/09/2014  1:15 PM   Type of Therapy: Group Therapy  Participation Level: Did Not Attend- patient in bed.   Tilden Fossa, MSW, [place] Worker [facility] [phone]

## 2014-07-09 NOTE — Progress Notes (Signed)
D: She was isolative to her room this evening. She reports ongoing vertigo that is relieved with the use of meclizine. Pt declined meclizine due to the "sleepy" effects of her scheduled qhs medications. Pt reports satisfaction with her current medication regimen. Pt is passively SI. Pt presents flat in affect and depressed in mood.  A: Writer administered scheduled medications to pt, per MD orders. Continued support and availability as needed was extended to this pt. Staff continue to monitor pt with q86min checks.  R: No adverse drug reactions noted. Pt receptive to treatment. Pt remains safe at this time.

## 2014-07-09 NOTE — Progress Notes (Signed)
D: Patient has blunted, depressed affect and mood. She declined completing the self inventory sheet. Writer observed that the patient seems slightly irritable and less motivated to get out of bed today. Patient has not attended any of the groups. Continues to be compliant with all medications.   A: Support and encouragement provided to patient. Scheduled medications administered per MD orders. Maintain Q15 minute checks for safety.  R: Patient receptive. Denies SI/HI and AVH. Patient remains safe.

## 2014-07-09 NOTE — BHH Group Notes (Signed)
[facility] LCSW Aftercare Discharge Planning Group Note  07/09/2014  8:45 AM  Participation Quality: Did Not Attend- patient in bed.  Tilden Fossa, MSW, [place] Worker [facility] [phone]

## 2014-07-09 NOTE — Clinical Social Work Note (Signed)
CSW spoke with patient's sister Kyra Leyland to update her on patient's progress.  Tilden Fossa, MSW, [place] Worker [facility] [phone]

## 2014-07-09 NOTE — Progress Notes (Signed)
Patient did not attend the evening speaker NA meeting. Pt was notified that group was beginning but remained in bed.

## 2014-07-09 NOTE — Progress Notes (Signed)
Patient ID: Ashley Farmer, female   DOB: Jun 22, 1951, 63 y.o.   MRN: [id number] [facility] MD Progress Note  07/09/2014 2:32 PM Jericka Kadar  MRN:  [id number] Subjective:   Patient states her mood is variable and she has brief periods where she feels better but at others continues to struggle with depression, sense of anxiety and sense of impending doom.  Objective:  I have discussed case with treatment team and have met with patient. Depression has gradually improved, but has persisted. Patient does have a more reactive affect, and seems less focused on her malignancy. She does state that although intellectually she understands that her tumor has not progressed and this improves her prognosis, emotionally she still tends to feel as if she has little future, and continues to have a sense it is difficult for her to be stoic and portray and image of stoicism and that " everything is OK" which she has tried to do for her family.  Patient does continue to focus on her symptoms of vertigo- today she explains she has had work up for this and has been told it is positional vertigo, but states it seemed to worsen after her malignancy was diagnosed. She does not fall, but feels nauseous often. She is debating whether to stay in [place] area after discharge versus go to [place] to live with her son. On unit cooperative, but staff reports patient  still tends to be isolated, and group participation has been variable.  She denies any suicidal ideations. She does not endorse medication side effects.  Diagnosis:  MDD , Panic Disorder, Consider Depression secondary to chronic medical illness   Total Time spent with patient: 25 minutes  ADL's:  Improving  Sleep: Good  Appetite: fair   Suicidal Ideation:  Denies SI at this time Homicidal Ideation:  Denies  AEB (as evidenced by):  Psychiatric Specialty Exam: Physical Exam  Review of Systems  Constitutional: Negative for fever, chills, weight loss,  malaise/fatigue and diaphoresis.  HENT: Negative for congestion, ear discharge, ear pain, hearing loss, nosebleeds and tinnitus.   Respiratory: Positive for cough. Negative for hemoptysis, shortness of breath, wheezing and stridor.   Cardiovascular: Negative for chest pain.  Gastrointestinal: Negative for heartburn, nausea, vomiting, abdominal pain, diarrhea and constipation.  Genitourinary: Negative for dysuria, urgency, frequency, hematuria and flank pain.  Musculoskeletal: Negative for myalgias, back pain, joint pain and neck pain.  Skin: Negative for itching and rash.  Neurological: Positive for dizziness. Negative for headaches.       Chronic  Endo/Heme/Allergies: Negative for environmental allergies and polydipsia. Does not bruise/bleed easily.  Psychiatric/Behavioral: Positive for depression and suicidal ideas. The patient is nervous/anxious.     Blood pressure 162/68, pulse 60, temperature 98.7 F (37.1 C), temperature source Oral, resp. rate 16, height _0  (1.676 m), weight 85.276 kg (188 lb).Body mass index is 30.36 kg/(m^2).  General Appearance: improved  Eye Contact::  Good  Speech:  Normal Rate  Volume:  Normal  Mood:  Depressed   Affect:  Remains constricted, but is reactive   Thought Process:  Goal Directed and Linear  Orientation:  Full (Time, Place, and Person)  Thought Content:  no hallucinations, no delusions  Suicidal Thoughts:  No at this time denies any thoughts of hurting self and  contracts for safety on unit   Homicidal Thoughts:  No  Memory: Recent and Remote grossly intact  Judgement:  Good  Insight:  Present  Psychomotor Activity:  Decreased  Concentration:  Good  Recall:  [place] of Knowledge:Good  Language: Good  Akathisia:  Negative  Handed:  Right  AIMS (if indicated):     Assets:  Communication Skills Desire for Improvement Resilience  Sleep:  Number of Hours: 6.5   Musculoskeletal: Strength & Muscle Tone: within normal limits Gait &  Station: normal Patient leans: N/A  Current Medications: Current Facility-Administered Medications  Medication Dose Route Frequency Provider Last Rate Last Dose  . estrogens (conjugated) (PREMARIN) tablet 0.3 mg  0.3 mg Oral QPM Shuvon Rankin, NP   0.3 mg at 07/08/14 1715  . ferrous sulfate tablet 325 mg  325 mg Oral Q breakfast Shuvon Rankin, NP   325 mg at 07/09/14 0756  . FLUoxetine (PROZAC) capsule 40 mg  40 mg Oral Daily Jenne Campus, MD   40 mg at 07/09/14 0756  . fluticasone (FLONASE) 50 MCG/ACT nasal spray 2 spray  2 spray Each Nare Daily PRN Shuvon Rankin, NP      . ibuprofen (ADVIL,MOTRIN) tablet 600 mg  600 mg Oral Q8H PRN Shuvon Rankin, NP      . [START ON 07/10/2014] lisinopril (PRINIVIL,ZESTRIL) tablet 10 mg  10 mg Oral Daily Kennedy Bucker, NP      . LORazepam (ATIVAN) tablet 0.5 mg  0.5 mg Oral TID Shuvon Rankin, NP   0.5 mg at 07/09/14 1151  . magnesium hydroxide (MILK OF MAGNESIA) suspension 30 mL  30 mL Oral Daily PRN Shuvon Rankin, NP      . meclizine (ANTIVERT) tablet 25 mg  25 mg Oral TID PRN Shuvon Rankin, NP   25 mg at 07/07/14 0615  . mirtazapine (REMERON) tablet 30 mg  30 mg Oral QHS Myer Peer Chikita Dogan, MD   30 mg at 07/08/14 2206  . nebivolol (BYSTOLIC) tablet 10 mg  10 mg Oral Daily Shuvon Rankin, NP   10 mg at 07/09/14 0756  . ondansetron (ZOFRAN) tablet 4 mg  4 mg Oral Q8H PRN Shuvon Rankin, NP      . ondansetron (ZOFRAN) tablet 4 mg  4 mg Oral Q6H Shuvon Rankin, NP   4 mg at 07/09/14 1330  . pantoprazole (PROTONIX) EC tablet 40 mg  40 mg Oral Daily Shuvon Rankin, NP   40 mg at 07/09/14 8416    Lab Results: No results found for this or any previous visit (from the past 48 hour(s)).  Physical Findings: AIMS: Facial and Oral Movements Muscles of Facial Expression: None, normal Lips and Perioral Area: None, normal Jaw: None, normal Tongue: None, normal,Extremity Movements Upper (arms, wrists, hands, fingers): None, normal Lower (legs, knees, ankles, toes):  None, normal, Trunk Movements Neck, shoulders, hips: None, normal, Overall Severity Severity of abnormal movements (highest score from questions above): None, normal Incapacitation due to abnormal movements: None, normal Patient's awareness of abnormal movements (rate only patient's report): No Awareness, Dental Status Current problems with teeth and/or dentures?: Yes Does patient usually wear dentures?: Yes  CIWA:    COWS:     Assessment : Gradual improvement, but remains depressed, sad, and tends to be isolative.  Although to a lesser degree, she continues to ruminate about her malignancy, and has difficulty seeing herself as living a long or productive life due to it, in spite of reassurance from oncologist that tumor has remained stable for several years. She is not suicidal or psychotic.  She is tolerating medications well . Treatment Plan Summary: Daily contact with patient to assess and evaluate symptoms and progress in treatment Medication management See below  Plan:  1. Continue inpatient treatment , milieu, support 2. Increase Prozac to 40  mgrs QAM  3. Ativan 0.5 mgrs TID  4. Increase Remeron   To 45 mgrs QHS 5. Meclizine and Zofran on PRN basis for dizziness/nausea   Medical Decision Making Problem Points:  Established problem, stable/improving (1), Review of last therapy session (1) and Review of psycho-social stressors (1) Data Points:  Review of medication regiment & side effects (2) Review of new medications or change in dosage (2)  I certify that inpatient services furnished can reasonably be expected to improve the patient's condition.   Countess Biebel, [place]  07/09/2014, 2:32 PM

## 2014-07-09 NOTE — Progress Notes (Signed)
D:Patient in her room in bed on approach.  Patient states she has been staying in bed due having vertigo.  Patient states she has been in bed reading.  Patient states she did not have a goal and she states she has not learned anything new.  Patient states she is passive SI but verbally contracts for safety.  Patient denies HI/AVH. A: Staff to monitor Q 15 mins for safety.  Encouragement and support offered.  Scheduled medications administered per orders.  Meclizine administered prn for vertigo. R: Patient remains safe on the unit.  Patient did not attend group tonight.  Patient not visible on the unit.  Patient taking  administered medications.

## 2014-07-09 NOTE — Plan of Care (Signed)
Problem: Alteration in mood Goal: STG-Patient is able to discuss feelings and issues (Patient is able to discuss feelings and issues leading to depression)  Outcome: Progressing Pt was able to discuss her current feelings to Probation officer. Pt feels that she is not where she wants to be in life.

## 2014-07-10 MED ORDER — MIRTAZAPINE 30 MG PO TABS
30.0000 mg | ORAL_TABLET | Freq: Every day | ORAL | Status: DC
  Administered 2014-07-10 – 2014-07-13 (×4): 30 mg via ORAL
  Filled 2014-07-10 (×5): qty 1

## 2014-07-10 NOTE — Progress Notes (Signed)
Adult Psychoeducational Group Note  Date:  07/10/2014 Time:  8:27 PM  Group Topic/Focus:  Wrap-Up Group:   The focus of this group is to help patients review their daily goal of treatment and discuss progress on daily workbooks.  Participation Level:  Active  Participation Quality:  Appropriate  Affect:  Appropriate  Cognitive:  Appropriate  Insight: Appropriate  Engagement in Group:  Engaged  Modes of Intervention:  Discussion  Additional Comments:  Pt was present for wrap up group. She shared that her goal was to take a shower today and not have vertigo. She stated that she accomplished this. She said that she is currently reading a good mystery novel. She is appropriate and cooperative on the unit. She seemed bright in group.   Harrie Foreman A 07/10/2014, 8:27 PM

## 2014-07-10 NOTE — Progress Notes (Signed)
Patient ID: Ashley Farmer, female   DOB: July 24, 1951, 63 y.o.   MRN: [id number] Patient ID: Ashley Farmer, female   DOB: [birth year], 63 y.o.   MRN: [id number] [facility] MD Progress Note  07/10/2014 4:41 PM Blaise Palladino  MRN:  [id number] Subjective:   Patient states her mood is ok.  She did state that she slept too much and thinks that the remeron dose She has brief periods where she feels better but at others continues to struggle with depression, sense of anxiety and sense of impending doom.  Objective:  I have discussed case with treatment team and have met with patient. Depressed mood has improved.  She is smiling when interacting staff and with others patients. She talks about Thanksgiving and hoping that next year at this time, she will not be in the hospital. On unit cooperative, but staff reports patient  still tends to be isolated, and group participation has been variable.  She denies any suicidal ideations. She does not endorse medication side effects.  Diagnosis:  MDD , Panic Disorder, Consider Depression secondary to chronic medical illness   Total Time spent with patient: 25 minutes  ADL's:  Improving  Sleep: Good  Appetite: fair   Suicidal Ideation:  Denies SI at this time Homicidal Ideation:  Denies  AEB (as evidenced by):  Psychiatric Specialty Exam: Physical Exam  Psychiatric: She has a normal mood and affect. Her behavior is normal. Judgment and thought content normal.    Review of Systems  Constitutional: Negative for fever, chills, weight loss, malaise/fatigue and diaphoresis.  HENT: Negative for congestion, ear discharge, ear pain, hearing loss, nosebleeds and tinnitus.   Respiratory: Positive for cough. Negative for hemoptysis, shortness of breath, wheezing and stridor.   Cardiovascular: Negative for chest pain.  Gastrointestinal: Negative for heartburn, nausea, vomiting, abdominal pain, diarrhea and constipation.  Genitourinary: Negative for dysuria, urgency,  frequency, hematuria and flank pain.  Musculoskeletal: Negative for myalgias, back pain, joint pain and neck pain.  Skin: Negative for itching and rash.  Neurological: Positive for dizziness. Negative for headaches.       Chronic  Endo/Heme/Allergies: Negative for environmental allergies and polydipsia. Does not bruise/bleed easily.  Psychiatric/Behavioral: Positive for depression and suicidal ideas. The patient is nervous/anxious.     Blood pressure 170/80, pulse 60, temperature 98.1 F (36.7 C), temperature source Oral, resp. rate 18, height 5' 6"  (1.676 m), weight 85.276 kg (188 lb).Body mass index is 30.36 kg/(m^2).  General Appearance: improved  Eye Contact::  Good  Speech:  Normal Rate  Volume:  Normal  Mood:  Depressed   Affect:  Remains constricted, but is reactive   Thought Process:  Goal Directed and Linear  Orientation:  Full (Time, Place, and Person)  Thought Content:  no hallucinations, no delusions  Suicidal Thoughts:  No at this time denies any thoughts of hurting self and  contracts for safety on unit   Homicidal Thoughts:  No  Memory: Recent and Remote grossly intact  Judgement:  Good  Insight:  Present  Psychomotor Activity:  Decreased  Concentration:  Good  Recall:  Good  Fund of Knowledge:Good  Language: Good  Akathisia:  Negative  Handed:  Right  AIMS (if indicated):     Assets:  Communication Skills Desire for Improvement Resilience  Sleep:  Number of Hours: 6   Musculoskeletal: Strength & Muscle Tone: within normal limits Gait & Station: normal Patient leans: N/A  Current Medications: Current Facility-Administered Medications  Medication Dose Route Frequency Provider Last Rate Last  Dose  . estrogens (conjugated) (PREMARIN) tablet 0.3 mg  0.3 mg Oral QPM Shuvon Rankin, NP   0.3 mg at 07/09/14 1705  . ferrous sulfate tablet 325 mg  325 mg Oral Q breakfast Shuvon Rankin, NP   325 mg at 07/10/14 0803  . FLUoxetine (PROZAC) capsule 40 mg  40 mg Oral  Daily Jenne Campus, MD   40 mg at 07/10/14 0803  . fluticasone (FLONASE) 50 MCG/ACT nasal spray 2 spray  2 spray Each Nare Daily PRN Shuvon Rankin, NP      . ibuprofen (ADVIL,MOTRIN) tablet 600 mg  600 mg Oral Q8H PRN Shuvon Rankin, NP      . lisinopril (PRINIVIL,ZESTRIL) tablet 10 mg  10 mg Oral Daily Kennedy Bucker, NP   10 mg at 07/10/14 0803  . LORazepam (ATIVAN) tablet 0.5 mg  0.5 mg Oral TID Shuvon Rankin, NP   0.5 mg at 07/10/14 1213  . magnesium hydroxide (MILK OF MAGNESIA) suspension 30 mL  30 mL Oral Daily PRN Shuvon Rankin, NP      . meclizine (ANTIVERT) tablet 25 mg  25 mg Oral TID PRN Shuvon Rankin, NP   25 mg at 07/10/14 1213  . mirtazapine (REMERON) tablet 45 mg  45 mg Oral QHS Jenne Campus, MD   45 mg at 07/09/14 2258  . nebivolol (BYSTOLIC) tablet 10 mg  10 mg Oral Daily Shuvon Rankin, NP   10 mg at 07/10/14 0803  . ondansetron (ZOFRAN) tablet 4 mg  4 mg Oral Q8H PRN Shuvon Rankin, NP      . ondansetron (ZOFRAN) tablet 4 mg  4 mg Oral Q6H Shuvon Rankin, NP   4 mg at 07/10/14 0804  . pantoprazole (PROTONIX) EC tablet 40 mg  40 mg Oral Daily Shuvon Rankin, NP   40 mg at 07/10/14 0805    Lab Results: No results found for this or any previous visit (from the past 87 hour(s)).  Physical Findings: AIMS: Facial and Oral Movements Muscles of Facial Expression: None, normal Lips and Perioral Area: None, normal Jaw: None, normal Tongue: None, normal,Extremity Movements Upper (arms, wrists, hands, fingers): None, normal Lower (legs, knees, ankles, toes): None, normal, Trunk Movements Neck, shoulders, hips: None, normal, Overall Severity Severity of abnormal movements (highest score from questions above): None, normal Incapacitation due to abnormal movements: None, normal Patient's awareness of abnormal movements (rate only patient's report): No Awareness, Dental Status Current problems with teeth and/or dentures?: Yes Does patient usually wear dentures?: Yes  CIWA:     COWS:     Assessment : Gradual improvement, but remains depressed, sad, and tends to be isolative.  Although to a lesser degree, she continues to ruminate about her malignancy, and has difficulty seeing herself as living a long or productive life due to it, in spite of reassurance from oncologist that tumor has remained stable for several years. She is not suicidal or psychotic.  She is tolerating medications well . Treatment Plan Summary: Daily contact with patient to assess and evaluate symptoms and progress in treatment Medication management See below  Plan: 1. Continue inpatient treatment , milieu, support 2. Increase Prozac to 40  mgrs QAM  3. Ativan 0.5 mgrs TID  4. Decrease Remeron to 30 mg QHS 5. Meclizine and Zofran on PRN basis for dizziness/nausea   Medical Decision Making Problem Points:  Established problem, stable/improving (1), Review of last therapy session (1) and Review of psycho-social stressors (1) Data Points:  Review of medication regiment & side  effects (2) Review of new medications or change in dosage (2)  I certify that inpatient services furnished can reasonably be expected to improve the patient's condition.   Kerrie Buffalo MAY, AGNP-BC 07/10/2014, 4:41 PM I agreed with the findings, treatment and disposition plan of this patient. Berniece Andreas, MD

## 2014-07-10 NOTE — Progress Notes (Signed)
D:Patient in her room on approach.  Patient states she has been depressed today.  Pattient appears depressed.  Patient rated her depression 7/10.  Patient states she is passive SI but verbally contracts for safety.  Patient denies HI/AVH.   A: Staff to monitor Q 15 mins for safety.  Encouragement and support offered.  Scheduled medications administered per orders.  Meclizine administered prn for dizziness. R: Patient remains safe on the unit.  Patient attended group tonight.  Patient visible on the unit.  Patient taking administered medications.

## 2014-07-10 NOTE — Progress Notes (Signed)
Patient ID: Ashley Farmer, female   DOB: Apr 21, 1951, 63 y.o.   MRN: [id number]  D: Patient has a flat affect on approach today. States she slept well after receiving medication. Staying in bed thus far this am. Continues to report depressed mood and passive SI on and off. Answers questions very briefly. Did talk some about her issues with vertigo and how it makes her really depressed. She is trying to look into any possibility that will work. Meclizine given. A: Staff will monitor on q 15 minute checks, follow treatment plan, and give medications as ordered. R: Cooperative on the unit.

## 2014-07-11 MED ORDER — LORATADINE 10 MG PO TABS
10.0000 mg | ORAL_TABLET | Freq: Every day | ORAL | Status: DC
  Administered 2014-07-11 – 2014-07-12 (×2): 10 mg via ORAL
  Filled 2014-07-11 (×5): qty 1

## 2014-07-11 MED ORDER — LORATADINE 10 MG PO TABS: 10.0000 mg | ORAL_TABLET | Freq: Every day | ORAL | Status: DC

## 2014-07-11 NOTE — Progress Notes (Signed)
Patient ID: Ashley Farmer, female   DOB: [birth year], 63 y.o.   MRN: [id number] PER STATE REGULATIONS 482.30  THIS CHART WAS REVIEWED FOR MEDICAL NECESSITY WITH RESPECT TO THE PATIENT'S ADMISSION/ DURATION OF STAY.  NEXT REVIEW DATE: 07/14/2014  Chauncy Lean, RN, BSN CASE MANAGER

## 2014-07-11 NOTE — Progress Notes (Signed)
Patient ID: Ashley Farmer, female   DOB: 08-Jun-1951, 63 y.o.   MRN: [id number]  D: Patient pleasant on approach today. Reports that she just physically does feel good. Reports coughing and having the dizziness this am along with some epigastric pain at times. Gives depression "5" and feelings of hopelessness "7". Currently denies any SI. Taking medications without issue. Still isolating in room. Not seen out in milieu often.  A: Staff will monitor on q 15 minute checks, follow treatment plan, and give medications as needed. R: Cooperative on the unit.

## 2014-07-11 NOTE — [facility] Group Notes (Signed)
   [facility] LCSW Aftercare Discharge Planning Group Note  07/11/2014  8:45 AM   Participation Quality: Alert, Appropriate and Oriented  Mood/Affect: Depressed and Flat  Depression Rating: 5  Anxiety Rating: 7  Thoughts of Suicide: Pt denies SI/HI  Will you contract for safety? Yes  Current AVH: Pt denies  Plan for Discharge/Comments: Pt attended discharge planning group and actively participated in group. CSW provided pt with today's workbook. Patient reports feeling physically unwell today. She denies SI/HI today. She will return home with family to follow up with outpatient services in either [place] or [place].  Transportation Means: Pt reports access to transportation  Supports: Patient's sister and son have been identified as supports.  Tilden Fossa, MSW, [place] Worker [facility] [phone]

## 2014-07-11 NOTE — B[facility] Group Notes (Signed)
[facility] Group Notes:  (Nursing/MHT/Case Management/Adjunct)  Date:  07/10/14  Time:  0900  Type of Therapy:  Nurse Education  Participation Level:  Did Not Attend  Participation Quality:    Affect:    Cognitive:    Insight:    Engagement in Group:    Modes of Intervention:    Summary of Progress/Problems:  Ashley Farmer 07/10/14 0900

## 2014-07-11 NOTE — Progress Notes (Signed)
Patient ID: Ashley Farmer, female   DOB: 1950-09-16, 63 y.o.   MRN: [id number] [facility] MD Progress Note  07/11/2014 12:09 PM Ashley Farmer  MRN:  [id number] Subjective:   Patient  Endorses some symptoms of depression and anxiety. Rates depression a 5 on 0-10 scale.    States vertigo is a trigger for feelings of impending doom and severe anxiety.   States that she has felt less steady on her feet today as "my head is swimming" Does not feel that antivert has been effective today, but reports that it does help some and she is trying to attend groups/be active in unit milieu. States that she feels support of family and talked with son, and sister on thanksgiving.  Endorses continued feelings of lonliness (sister/family unable to visit yesterday), depressed mood, and anhedonia. Denies suicidal ideation, Homicidal ideation, auditory or visual hallucinations.  Objective:  I have discussed case with treatment team and have met with patient. Depressed mood has improved per patient report.  Affect is neutral, patient talkative and affect and mood are euthymic.  On unit cooperative, but staff reports patient  still tends to be isolated, and group participation has been variable.   Patient states that variable participation is due to nausea, and vertigo currently experienced. She denies any suicidal ideations. She does not endorse medication side effects and is tolerating all medications without difficulty.    Diagnosis:  MDD , Panic Disorder, Consider Depression secondary to chronic medical illness   Total Time spent with patient: 25 minutes  ADL's:  Improving  Sleep: Good  Appetite: fair   Suicidal Ideation:  Denies SI at this time Homicidal Ideation:  Denies  AEB (as evidenced by):  Psychiatric Specialty Exam: Physical Exam  Constitutional: She is oriented to person, place, and time. She appears well-developed and well-nourished. No distress.  HENT:  Head: Normocephalic and atraumatic.   Neurological: She is alert and oriented to person, place, and time. Coordination (reports feeling unsteady on feet ) abnormal.  Skin: Skin is warm and dry.  Psychiatric: She has a normal mood and affect. Her behavior is normal. Judgment and thought content normal.    Review of Systems  Constitutional: Positive for malaise/fatigue. Negative for fever, chills, weight loss and diaphoresis.  HENT: Positive for tinnitus. Negative for congestion, ear discharge, ear pain, hearing loss and nosebleeds.   Respiratory: Positive for cough. Negative for hemoptysis, shortness of breath, wheezing and stridor.   Cardiovascular: Negative for chest pain.  Gastrointestinal: Positive for nausea. Negative for heartburn, vomiting, abdominal pain, diarrhea and constipation.  Genitourinary: Negative for dysuria, urgency, frequency, hematuria and flank pain.  Musculoskeletal: Negative for myalgias, back pain, joint pain and neck pain.  Skin: Negative for itching and rash.  Neurological: Positive for dizziness. Negative for headaches.       Chronic  Endo/Heme/Allergies: Negative for environmental allergies and polydipsia. Does not bruise/bleed easily.  Psychiatric/Behavioral: Positive for depression. Negative for suicidal ideas. The patient is nervous/anxious.     Blood pressure 120/79, pulse 65, temperature 98.6 F (37 C), temperature source Oral, resp. rate 12, height 5\' 6"  (1.676 m), weight 85.276 kg (188 lb).Body mass index is 30.36 kg/(m^2).  General Appearance: improved  Eye Contact::  Good  Speech:  Normal Rate  Volume:  Normal  Mood:  Depressed   Affect:  Remains constricted, but is reactive   Thought Process:  Goal Directed and Linear  Orientation:  Full (Time, Place, and Person)  Thought Content:  no hallucinations, no delusions  Suicidal  Thoughts:  No at this time denies any thoughts of hurting self and  contracts for safety on unit   Homicidal Thoughts:  No  Memory: Recent and Remote grossly  intact  Judgement:  Good  Insight:  Present  Psychomotor Activity:  Decreased  Concentration:  Good  Recall:  Good  Fund of Knowledge:Good  Language: Good  Akathisia:  Negative  Handed:  Right  AIMS (if indicated):     Assets:  Communication Skills Desire for Improvement Resilience  Sleep:  Number of Hours: 6   Musculoskeletal: Strength & Muscle Tone: within normal limits Gait & Station: normal Patient leans: N/A  Current Medications: Current Facility-Administered Medications  Medication Dose Route Frequency Provider Last Rate Last Dose  . estrogens (conjugated) (PREMARIN) tablet 0.3 mg  0.3 mg Oral QPM Shuvon Rankin, NP   0.3 mg at 07/10/14 1712  . ferrous sulfate tablet 325 mg  325 mg Oral Q breakfast Shuvon Rankin, NP   325 mg at 07/11/14 0832  . FLUoxetine (PROZAC) capsule 40 mg  40 mg Oral Daily Jenne Campus, MD   40 mg at 07/11/14 5176  . fluticasone (FLONASE) 50 MCG/ACT nasal spray 2 spray  2 spray Each Nare Daily PRN Shuvon Rankin, NP      . ibuprofen (ADVIL,MOTRIN) tablet 600 mg  600 mg Oral Q8H PRN Shuvon Rankin, NP      . lisinopril (PRINIVIL,ZESTRIL) tablet 10 mg  10 mg Oral Daily Kennedy Bucker, NP   10 mg at 07/11/14 0832  . loratadine (CLARITIN) tablet 10 mg  10 mg Oral Daily Kennedy Bucker, NP      . LORazepam (ATIVAN) tablet 0.5 mg  0.5 mg Oral TID Shuvon Rankin, NP   0.5 mg at 07/11/14 1207  . magnesium hydroxide (MILK OF MAGNESIA) suspension 30 mL  30 mL Oral Daily PRN Shuvon Rankin, NP      . meclizine (ANTIVERT) tablet 25 mg  25 mg Oral TID PRN Shuvon Rankin, NP   25 mg at 07/11/14 0837  . mirtazapine (REMERON) tablet 30 mg  30 mg Oral QHS Sheila May Agustin, NP   30 mg at 07/10/14 2306  . nebivolol (BYSTOLIC) tablet 10 mg  10 mg Oral Daily Shuvon Rankin, NP   10 mg at 07/11/14 1607  . ondansetron (ZOFRAN) tablet 4 mg  4 mg Oral Q8H PRN Shuvon Rankin, NP      . ondansetron (ZOFRAN) tablet 4 mg  4 mg Oral Q6H Shuvon Rankin, NP   4 mg at 07/10/14 2000  .  pantoprazole (PROTONIX) EC tablet 40 mg  40 mg Oral Daily Shuvon Rankin, NP   40 mg at 07/11/14 3710    Lab Results: No results found for this or any previous visit (from the past 48 hour(s)).  Physical Findings: AIMS: Facial and Oral Movements Muscles of Facial Expression: None, normal Lips and Perioral Area: None, normal Jaw: None, normal Tongue: None, normal,Extremity Movements Upper (arms, wrists, hands, fingers): None, normal Lower (legs, knees, ankles, toes): None, normal, Trunk Movements Neck, shoulders, hips: None, normal, Overall Severity Severity of abnormal movements (highest score from questions above): None, normal Incapacitation due to abnormal movements: None, normal Patient's awareness of abnormal movements (rate only patient's report): No Awareness, Dental Status Current problems with teeth and/or dentures?: Yes Does patient usually wear dentures?: Yes  CIWA:    COWS:     Assessment : Gradual improvement, but remains depressed, sad, and tends to be isolative. Focused on vertigo today and stated "  i know my tumor hasn't grown",  She is not suicidal or psychotic.  She is tolerating medications well . Treatment Plan Summary: Daily contact with patient to assess and evaluate symptoms and progress in treatment Medication management See below  Plan: 1. Continue inpatient treatment , milieu, support 2. Maintain Prozac to 40  mgrs QAM  3. Continue Ativan 0.5 mgrs TID  4. Maintain 30 mg QHS 5. Meclizine and Zofran on PRN basis for dizziness/nausea 6.  Add Claritin  1 tab for c/o sinus pressure/seasonal allergies/ vertigo   Medical Decision Making Problem Points:  Established problem, stable/improving (1), Review of last therapy session (1) and Review of psycho-social stressors (1) Data Points:  Review of medication regiment & side effects (2) Review of new medications or change in dosage (2)  I certify that inpatient services furnished can reasonably be expected to  improve the patient's condition.   Kennedy Bucker PMH-NP 07/11/2014, 12:09 PM I agreed with the findings, treatment and disposition plan of this patient. Berniece Andreas, MD

## 2014-07-12 MED ORDER — SCOPOLAMINE 1 MG/3DAYS TD PT72
1.0000 | MEDICATED_PATCH | TRANSDERMAL | Status: DC
  Administered 2014-07-12: 1.5 mg via TRANSDERMAL
  Filled 2014-07-12: qty 1

## 2014-07-12 NOTE — Progress Notes (Signed)
D: Pt's mood is depressed. She interacts minimally with her peers and has not left her room tonight. She is observed calmly reading a book in bed.  A: Support given. Verbalization encouraged. Pt encouraged to come to staff with any concerns. Medications given as prescribed.  R: Pt is receptive. No complaints of pain or discomfort at this time. Q15 min safety checks maintained. Will continue to monitor pt.

## 2014-07-12 NOTE — [facility] Group Notes (Signed)
0900 nursing orientation group.    The focus of this group is to educate the patient on the purpose and policies of crisis stabilization and provide a format to answer questions about their admission.  The group details unit policies and expectations of patients while admitted.  Pt did not attend she was in her bed asleep.

## 2014-07-12 NOTE — Progress Notes (Signed)
Psychoeducational Group Note  Date:  07/12/2014 Time: 2238  Group Topic/Focus:  Wrap-Up Group:   The focus of this group is to help patients review their daily goal of treatment and discuss progress on daily workbooks.  Participation Level: Did Not Attend  Participation Quality:  Not Applicable  Affect:  Not Applicable  Cognitive:  Not Applicable  Insight:  Not Applicable  Engagement in Group: Not Applicable  Additional Comments:  The patient did not attend group this evening despite this author's encouragement.   Archie Balboa S 07/12/2014, 10:38 PM

## 2014-07-12 NOTE — [facility] Group Notes (Signed)
[place] LCSW Group Therapy 07/12/2014 10:56 AM  Type of Therapy and Topic: Group Therapy: Avoiding Self-Sabotaging and Enabling Behaviors   Pt did not attend for unknown reason.   Peri Maris, LCSWA 07/12/2014 10:56 AM

## 2014-07-12 NOTE — Progress Notes (Signed)
Patient ID: Ashley Farmer, female   DOB: 06-Jan-1951, 63 y.o.   MRN: [id number] Patient ID: Ashley Farmer, female   DOB: May 15, 1951, 63 y.o.   MRN: [id number] [facility] MD Progress Note  07/12/2014 4:04 PM [name]  MRN:  [id number] Subjective:   Patient  endorses some symptoms of depression and anxiety. She is in bed and frustrated that she is still dizzy.  Rates depression a 7 on 0-10 scale.    Still endorsing that vertigo is a trigger for feelings of impending doom and severe anxiety.   States that she has felt less steady on her feet today as "my head is swimming" Does not feel that antivert has been effective today, but reports that it does help some and she is trying to attend groups/be active in unit milieu. She deferred into making changed to her dose.  Denies suicidal ideation, Homicidal ideation, auditory or visual hallucinations.  Objective:  I have discussed case with treatment team and have met with patient. Depressed mood has improved per patient report.  Affect is neutral, patient talkative and affect and mood are euthymic.  On unit cooperative, but staff reports patient  still tends to be isolated, and group participation has been variable.   Patient states that variable participation is due to nausea, and vertigo currently experienced. She denies any suicidal ideations. She does not endorse medication side effects and is tolerating all medications without difficulty.    Diagnosis:  MDD , Panic Disorder, Consider Depression secondary to chronic medical illness   Total Time spent with patient: 25 minutes  ADL's:  Improving  Sleep: Good  Appetite: fair   Suicidal Ideation:  Denies SI at this time Homicidal Ideation:  Denies  AEB (as evidenced by):  Psychiatric Specialty Exam: Physical Exam  Constitutional: She is oriented to person, place, and time. She appears well-developed and well-nourished. No distress.  HENT:  Head: Normocephalic and atraumatic.  Neurological: She is  alert and oriented to person, place, and time. Coordination (reports feeling unsteady on feet ) abnormal.  Skin: Skin is warm and dry.  Psychiatric: She has a normal mood and affect. Her behavior is normal. Judgment and thought content normal.    Review of Systems  Constitutional: Positive for malaise/fatigue. Negative for fever, chills, weight loss and diaphoresis.  HENT: Positive for tinnitus. Negative for congestion, ear discharge, ear pain, hearing loss and nosebleeds.   Respiratory: Positive for cough. Negative for hemoptysis, shortness of breath, wheezing and stridor.   Cardiovascular: Negative for chest pain.  Gastrointestinal: Positive for nausea. Negative for heartburn, vomiting, abdominal pain, diarrhea and constipation.  Genitourinary: Negative for dysuria, urgency, frequency, hematuria and flank pain.  Musculoskeletal: Negative for myalgias, back pain, joint pain and neck pain.  Skin: Negative for itching and rash.  Neurological: Positive for dizziness. Negative for headaches.       Chronic  Endo/Heme/Allergies: Negative for environmental allergies and polydipsia. Does not bruise/bleed easily.  Psychiatric/Behavioral: Positive for depression. Negative for suicidal ideas. The patient is nervous/anxious.     Blood pressure 149/72, pulse 69, temperature 98.7 F (37.1 C), temperature source Oral, resp. rate 20, height 5' 6"  (1.676 m), weight 85.276 kg (188 lb).Body mass index is 30.36 kg/(m^2).  General Appearance: improved  Eye Contact::  Good  Speech:  Normal Rate  Volume:  Normal  Mood:  Depressed   Affect:  Remains constricted, but is reactive   Thought Process:  Goal Directed and Linear  Orientation:  Full (Time, Place, and Person)  Thought  Content:  no hallucinations, no delusions  Suicidal Thoughts:  No at this time denies any thoughts of hurting self and  contracts for safety on unit   Homicidal Thoughts:  No  Memory: Recent and Remote grossly intact  Judgement:  Good   Insight:  Present  Psychomotor Activity:  Decreased  Concentration:  Good  Recall:  Good  Fund of Knowledge:Good  Language: Good  Akathisia:  Negative  Handed:  Right  AIMS (if indicated):     Assets:  Communication Skills Desire for Improvement Resilience  Sleep:  Number of Hours: 6.75   Musculoskeletal: Strength & Muscle Tone: within normal limits Gait & Station: normal Patient leans: N/A  Current Medications: Current Facility-Administered Medications  Medication Dose Route Frequency Provider Last Rate Last Dose  . estrogens (conjugated) (PREMARIN) tablet 0.3 mg  0.3 mg Oral QPM Shuvon Rankin, NP   0.3 mg at 07/11/14 1714  . ferrous sulfate tablet 325 mg  325 mg Oral Q breakfast Shuvon Rankin, NP   325 mg at 07/12/14 0800  . FLUoxetine (PROZAC) capsule 40 mg  40 mg Oral Daily Myer Peer Cobos, MD   40 mg at 07/12/14 0800  . fluticasone (FLONASE) 50 MCG/ACT nasal spray 2 spray  2 spray Each Nare Daily PRN Shuvon Rankin, NP      . ibuprofen (ADVIL,MOTRIN) tablet 600 mg  600 mg Oral Q8H PRN Shuvon Rankin, NP      . lisinopril (PRINIVIL,ZESTRIL) tablet 10 mg  10 mg Oral Daily Kennedy Bucker, NP   10 mg at 07/12/14 0800  . loratadine (CLARITIN) tablet 10 mg  10 mg Oral Daily Kennedy Bucker, NP   10 mg at 07/12/14 0800  . LORazepam (ATIVAN) tablet 0.5 mg  0.5 mg Oral TID Shuvon Rankin, NP   0.5 mg at 07/12/14 1429  . magnesium hydroxide (MILK OF MAGNESIA) suspension 30 mL  30 mL Oral Daily PRN Shuvon Rankin, NP      . meclizine (ANTIVERT) tablet 25 mg  25 mg Oral TID PRN Shuvon Rankin, NP   25 mg at 07/12/14 1429  . mirtazapine (REMERON) tablet 30 mg  30 mg Oral QHS Audryna Wendt May Caydyn Sprung, NP   30 mg at 07/11/14 2258  . nebivolol (BYSTOLIC) tablet 10 mg  10 mg Oral Daily Shuvon Rankin, NP   10 mg at 07/12/14 0800  . ondansetron (ZOFRAN) tablet 4 mg  4 mg Oral Q8H PRN Shuvon Rankin, NP      . ondansetron (ZOFRAN) tablet 4 mg  4 mg Oral Q6H Shuvon Rankin, NP   4 mg at 07/12/14 1429  .  pantoprazole (PROTONIX) EC tablet 40 mg  40 mg Oral Daily Shuvon Rankin, NP   40 mg at 07/12/14 0800  . scopolamine (TRANSDERM-SCOP) 1 MG/3DAYS 1.5 mg  1 patch Transdermal Q72H Janett Labella, NP        Lab Results: No results found for this or any previous visit (from the past 48 hour(s)).  Physical Findings: AIMS: Facial and Oral Movements Muscles of Facial Expression: None, normal Lips and Perioral Area: None, normal Jaw: None, normal Tongue: None, normal,Extremity Movements Upper (arms, wrists, hands, fingers): None, normal Lower (legs, knees, ankles, toes): None, normal, Trunk Movements Neck, shoulders, hips: None, normal, Overall Severity Severity of abnormal movements (highest score from questions above): None, normal Incapacitation due to abnormal movements: None, normal Patient's awareness of abnormal movements (rate only patient's report): No Awareness, Dental Status Current problems with teeth and/or dentures?: Yes Does patient usually  wear dentures?: Yes  CIWA:    COWS:     Assessment : Gradual improvement, but remains depressed, sad, and tends to be isolative. Focused on vertigo today and stated "i know my tumor hasn't grown",  She is not suicidal or psychotic.  She is tolerating medications well . Treatment Plan Summary: Daily contact with patient to assess and evaluate symptoms and progress in treatment Medication management See below  Plan: 1. Continue inpatient treatment , milieu, support 2. Maintain Prozac to 40  mgrs QAM  3. Continue Ativan 0.5 mgrs TID  4. Maintain 30 mg QHS 5. Meclizine and Zofran on PRN basis for dizziness/nausea 6.  Add Claritin  1 tab for c/o sinus pressure/seasonal allergies/ vertigo 7.  Added scopolamine 1.5 transdermal patch   Medical Decision Making Problem Points:  Established problem, stable/improving (1), Review of last therapy session (1) and Review of psycho-social stressors (1) Data Points:  Review of medication regiment  & side effects (2) Review of new medications or change in dosage (2)  I certify that inpatient services furnished can reasonably be expected to improve the patient's condition.   Kerrie Buffalo MAY, AGNP-BC 07/12/2014, 4:04 PM I agreed with the findings, treatment and disposition plan of this patient. Berniece Andreas, MD

## 2014-07-12 NOTE — Progress Notes (Signed)
D   Pt spends most of her time in her room  She said she started out having a bad day but it got better and she is feeling more positive   Her mood and affect is more animated and she smiles and talks more than a few days ago A   Verbal support given   Medications administered and effectiveness monitored   Q 15 min checks R   Pt safe at present and receptive to verbal support

## 2014-07-12 NOTE — Progress Notes (Signed)
Pt did not attend group this evening.  

## 2014-07-12 NOTE — Progress Notes (Signed)
Ashley Farmer spends any and all of her time lying ( awake and asleep) in her bed in her room. She says" I feel awful...Marland KitchenMarland KitchenMarland Kitchen Can   you bring me some more of that medicine for my nausea? " '     A She is instructed by this nurse to monitor her po fluid intake ( in  [place] of little to no solid food po intake)   R She requested and was given prn ativan, zofran and antivert for c/o nausea and vomiting with  zofran ( for nausea) and finally started on scopalalmine ( for n&v ) .

## 2014-07-13 MED ORDER — MECLIZINE HCL 25 MG PO TABS
25.0000 mg | ORAL_TABLET | Freq: Three times a day (TID) | ORAL | Status: DC | PRN
  Administered 2014-07-14 – 2014-07-16 (×3): 25 mg via ORAL
  Filled 2014-07-13 (×4): qty 1

## 2014-07-13 MED ORDER — ENSURE COMPLETE PO LIQD
237.0000 mL | Freq: Two times a day (BID) | ORAL | Status: DC
  Administered 2014-07-14 – 2014-07-16 (×2): 237 mL via ORAL

## 2014-07-13 NOTE — Progress Notes (Signed)
Ashley Farmer has spent all day laying in her bed...just like yesterday.   A She takes her meds as scheduled and she writes on her morning assessment  That she denies SI  Within the past 24 hrs, and she rates her depression, hopelessness and  anxiety "7/7/7".   R Safety in plae. POC includes cont to monitor medication efficacy, dc meclizine and support pt.

## 2014-07-13 NOTE — Progress Notes (Signed)
Pt complains of a severe episode of vertigo this evening. Pt reports the patch as ineffective and that the Meclizine provided her with better relief. The patch was discontinued by the on-cal extender. Meclizine was reordered for this pt's use.

## 2014-07-13 NOTE — [facility] Group Notes (Signed)
[place] LCSW Group Therapy  07/13/2014 10:00 AM   Type of Therapy:  Group Therapy  Participation Level:  Did Not Attend  Regan Lemming, LCSW 07/13/2014 10:32 AM

## 2014-07-13 NOTE — [facility] Group Notes (Signed)
The focus of this group is to educate the patient on the purpose and policies of crisis stabilization and provide a format to answer questions about their admission.  The group details unit policies and expectations of patients while admitted.  Patient did not attend 0900 nurse education orientation group this morning.  Patient stayed in bed.   

## 2014-07-13 NOTE — Progress Notes (Signed)
Patient ID: Ashley Farmer, female   DOB: 1951/07/23, 63 y.o.   MRN: [id number] Patient ID: Ashley Farmer, female   DOB: 07-05-1951, 63 y.o.   MRN: [id number] Patient ID: Ashley Farmer, female   DOB: [birth year], 63 y.o.   MRN: [id number] [facility] MD Progress Note  07/13/2014 4:09 PM Ashley Farmer  MRN:  [id number] Subjective:   Patient  endorses some symptoms of depression and anxiety. She is in bed and frustrated that she is still dizzy.  Rates depression a 6 on 0-10 scale.    Still endorsing that vertigo is a trigger for feelings of impending doom and severe anxiety.   States that scopolamine patch improved her symptoms.  Requesting that she is also able to get Meclizine.  Reports that she is trying to attend groups/be active in unit milieu. She deferred into making changed to her dose.  Denies suicidal ideation, Homicidal ideation, auditory or visual hallucinations.  Objective:  I have discussed case with treatment team and have met with patient. Depressed mood has improved per patient report.  Affect is neutral, patient talkative and affect and mood are euthymic.  On unit cooperative, but staff reports patient  still tends to be isolated, and group participation has been variable.   Patient states that variable participation is due to nausea, and vertigo currently experienced. She denies any suicidal ideations. She does not endorse medication side effects and is tolerating all medications without difficulty.    Diagnosis:  MDD , Panic Disorder, Consider Depression secondary to chronic medical illness   Total Time spent with patient: 25 minutes  ADL's:  Improving  Sleep: Good  Appetite: fair   Suicidal Ideation:  Denies SI at this time Homicidal Ideation:  Denies  AEB (as evidenced by):  Psychiatric Specialty Exam: Physical Exam  Constitutional: She is oriented to person, place, and time. She appears well-developed and well-nourished. No distress.  HENT:  Head: Normocephalic and atraumatic.   Neurological: She is alert and oriented to person, place, and time. Coordination (reports feeling unsteady on feet ) abnormal.  Skin: Skin is warm and dry.  Psychiatric: She has a normal mood and affect. Her behavior is normal. Judgment and thought content normal.    Review of Systems  Constitutional: Positive for malaise/fatigue. Negative for fever, chills, weight loss and diaphoresis.  HENT: Positive for tinnitus. Negative for congestion, ear discharge, ear pain, hearing loss and nosebleeds.   Respiratory: Positive for cough. Negative for hemoptysis, shortness of breath, wheezing and stridor.   Cardiovascular: Negative for chest pain.  Gastrointestinal: Positive for nausea. Negative for heartburn, vomiting, abdominal pain, diarrhea and constipation.  Genitourinary: Negative for dysuria, urgency, frequency, hematuria and flank pain.  Musculoskeletal: Negative for myalgias, back pain, joint pain and neck pain.  Skin: Negative for itching and rash.  Neurological: Positive for dizziness. Negative for headaches.       Chronic  Endo/Heme/Allergies: Negative for environmental allergies and polydipsia. Does not bruise/bleed easily.  Psychiatric/Behavioral: Positive for depression. Negative for suicidal ideas. The patient is nervous/anxious.     Blood pressure 158/73, pulse 62, temperature 98 F (36.7 C), temperature source Oral, resp. rate 18, height _0  (1.676 m), weight 85.276 kg (188 lb).Body mass index is 30.36 kg/(m^2).  General Appearance: improved  Eye Contact::  Good  Speech:  Normal Rate  Volume:  Normal  Mood:  Depressed   Affect:  Remains constricted, but is reactive   Thought Process:  Goal Directed and Linear  Orientation:  Full (Time, Place, and Person)  Thought Content:  no hallucinations, no delusions  Suicidal Thoughts:  No at this time denies any thoughts of hurting self and  contracts for safety on unit   Homicidal Thoughts:  No  Memory: Recent and Remote grossly  intact  Judgement:  Good  Insight:  Present  Psychomotor Activity:  Decreased  Concentration:  Good  Recall:  Good  Fund of Knowledge:Good  Language: Good  Akathisia:  Negative  Handed:  Right  AIMS (if indicated):     Assets:  Communication Skills Desire for Improvement Resilience  Sleep:  Number of Hours: 6.75   Musculoskeletal: Strength & Muscle Tone: within normal limits Gait & Station: normal Patient leans: N/A  Current Medications: Current Facility-Administered Medications  Medication Dose Route Frequency Provider Last Rate Last Dose  . estrogens (conjugated) (PREMARIN) tablet 0.3 mg  0.3 mg Oral QPM Shuvon Rankin, NP   0.3 mg at 07/12/14 1830  . ferrous sulfate tablet 325 mg  325 mg Oral Q breakfast Shuvon Rankin, NP   325 mg at 07/13/14 0823  . FLUoxetine (PROZAC) capsule 40 mg  40 mg Oral Daily Jenne Campus, MD   40 mg at 07/13/14 8563  . fluticasone (FLONASE) 50 MCG/ACT nasal spray 2 spray  2 spray Each Nare Daily PRN Shuvon Rankin, NP      . ibuprofen (ADVIL,MOTRIN) tablet 600 mg  600 mg Oral Q8H PRN Shuvon Rankin, NP      . lisinopril (PRINIVIL,ZESTRIL) tablet 10 mg  10 mg Oral Daily Kennedy Bucker, NP   10 mg at 07/13/14 1497  . LORazepam (ATIVAN) tablet 0.5 mg  0.5 mg Oral TID Shuvon Rankin, NP   0.5 mg at 07/13/14 1250  . magnesium hydroxide (MILK OF MAGNESIA) suspension 30 mL  30 mL Oral Daily PRN Shuvon Rankin, NP      . meclizine (ANTIVERT) tablet 25 mg  25 mg Oral TID PRN Shuvon Rankin, NP   25 mg at 07/12/14 1429  . mirtazapine (REMERON) tablet 30 mg  30 mg Oral QHS Janett Labella, NP   30 mg at 07/12/14 2101  . nebivolol (BYSTOLIC) tablet 10 mg  10 mg Oral Daily Shuvon Rankin, NP   10 mg at 07/13/14 0263  . ondansetron (ZOFRAN) tablet 4 mg  4 mg Oral Q8H PRN Shuvon Rankin, NP      . ondansetron (ZOFRAN) tablet 4 mg  4 mg Oral Q6H Shuvon Rankin, NP   4 mg at 07/13/14 0821  . pantoprazole (PROTONIX) EC tablet 40 mg  40 mg Oral Daily Shuvon Rankin, NP    40 mg at 07/13/14 0830  . scopolamine (TRANSDERM-SCOP) 1 MG/3DAYS 1.5 mg  1 patch Transdermal Q72H Janett Labella, NP   1.5 mg at 07/12/14 1830    Lab Results: No results found for this or any previous visit (from the past 48 hour(s)).  Physical Findings: AIMS: Facial and Oral Movements Muscles of Facial Expression: None, normal Lips and Perioral Area: None, normal Jaw: None, normal Tongue: None, normal,Extremity Movements Upper (arms, wrists, hands, fingers): None, normal Lower (legs, knees, ankles, toes): None, normal, Trunk Movements Neck, shoulders, hips: None, normal, Overall Severity Severity of abnormal movements (highest score from questions above): None, normal Incapacitation due to abnormal movements: None, normal Patient's awareness of abnormal movements (rate only patient's report): No Awareness, Dental Status Current problems with teeth and/or dentures?: Yes Does patient usually wear dentures?: Yes  CIWA:    COWS:     Assessment : Gradual improvement, but  remains depressed, sad, and tends to be isolative. Focused on vertigo today and stated "i know my tumor hasn't grown",  She is not suicidal or psychotic.  She is tolerating medications well . Treatment Plan Summary: Daily contact with patient to assess and evaluate symptoms and progress in treatment Medication management See below  Plan: 1. Continue inpatient treatment , milieu, support 2. Maintain Prozac to 40  mgrs QAM  3. Continue Ativan 0.5 mgrs TID  4. Maintain 30 mg QHS 5. Meclizine and Zofran on PRN basis for dizziness/nausea 6.  Add Claritin  1 tab for c/o sinus pressure/seasonal allergies/ vertigo 7.  Added scopolamine 1.5 transdermal patch   Medical Decision Making Problem Points:  Established problem, stable/improving (1), Review of last therapy session (1) and Review of psycho-social stressors (1) Data Points:  Review of medication regiment & side effects (2) Review of new medications or change  in dosage (2)  I certify that inpatient services furnished can reasonably be expected to improve the patient's condition.   Ashley Farmer, AGNP-BC 07/13/2014, 4:09 PM I agreed with the findings, treatment and disposition plan of this patient. Berniece Andreas, MD

## 2014-07-14 ENCOUNTER — Ambulatory Visit: Payer: Medicare Other | Admitting: Nurse Practitioner

## 2014-07-14 ENCOUNTER — Other Ambulatory Visit: Payer: Medicare Other

## 2014-07-14 MED ORDER — MIRTAZAPINE 15 MG PO TABS
45.0000 mg | ORAL_TABLET | Freq: Every day | ORAL | Status: DC
Start: 2014-07-14 — End: 2014-07-16
  Administered 2014-07-14 – 2014-07-15 (×2): 45 mg via ORAL
  Filled 2014-07-14: qty 12
  Filled 2014-07-14 (×3): qty 3

## 2014-07-14 NOTE — [facility] Group Notes (Signed)
[facility] LCSW Group Therapy 07/14/2014  1:15 pm  Type of Therapy: Group Therapy Participation Level: Active  Participation Quality: Attentive, Sharing and Supportive  Affect: Depressed and Flat  Cognitive: Alert and Oriented  Insight: Developing/Improving and Engaged  Engagement in Therapy: Developing/Improving and Engaged  Modes of Intervention: Clarification, Confrontation, Discussion, Education, Exploration,  Limit-setting, Orientation, Problem-solving, Rapport Building, Art therapist, Socialization and Support  Summary of Progress/Problems: Pt identified obstacles faced currently and processed barriers involved in overcoming these obstacles. Pt identified steps necessary for overcoming these obstacles and explored motivation (internal and external) for facing these difficulties head on. Pt further identified one area of concern in their lives and chose a goal to focus on for today. Patient identified her loss of independence as her primary obstacle. She discussed her desire to focus on caring for herself and her move to [facility] with her son in the near future. CSW and group members provided emotional support and encouragement.  Tilden Fossa, MSW, [place] Worker [facility] [phone]

## 2014-07-14 NOTE — Progress Notes (Signed)
Psychoeducational Group Note  Date:  07/13/2014 Time:  2330  Group Topic/Focus:  Wrap-Up Group:   The focus of this group is to help patients review their daily goal of treatment and discuss progress on daily workbooks.  Participation Level: Did Not Attend  Participation Quality:  Not Applicable  Affect:  Not Applicable  Cognitive:  Not Applicable  Insight:  Not Applicable  Engagement in Group: Not Applicable  Additional Comments:  The patient did not attend group this evening since she was not feeling well, ie. Vertigo.   Nhu Glasby S 07/14/2014, 12:41 AM

## 2014-07-14 NOTE — [facility] Group Notes (Signed)
[facility] LCSW Aftercare Discharge Planning Group Note  07/14/2014  8:45 AM  Participation Quality: Did Not Attend- patient reports that she feels unwell due to vertigo.  Tilden Fossa, MSW, [place] Worker [facility] [phone]

## 2014-07-14 NOTE — Progress Notes (Signed)
Patient ID: Brandace Cargle, female   DOB: 02-08-1951, 63 y.o.   MRN: [id number] PER STATE REGULATIONS 482.30  THIS CHART WAS REVIEWED FOR MEDICAL NECESSITY WITH RESPECT TO THE PATIENT'S ADMISSION/ DURATION OF STAY.  NEXT REVIEW DATE: 07/18/2014  Chauncy Lean, RN, BSN CASE MANAGER

## 2014-07-14 NOTE — Progress Notes (Signed)
Patient ID: Ashley Farmer, female   DOB: Jul 16, 1951, 63 y.o.   MRN: [id number] [facility] MD Progress Note  07/14/2014 1:17 PM Ashley Farmer  MRN:  [id number] Subjective:   Patient continues to report chronic nausea and vertigo, but does state meclizine is helping. She states she remains depressed, but does state she is improved .   Objective:  I have discussed case with treatment team and have met with patient. As discussed with staff patient has tended to spend long periods of time in bed, and group/milieu attendance has been irregular at times.  She presents sad and constricted in affect at times. Patient states she simply prefers to stay in bed often, partly due to chronic symptoms. She acknowledges partial improvement of mood , although as noted, she has continued to have a constricted affect. She does smile at times appropriately , however. She is tolerating medications well and denies side effects. Patient is future oriented and is thinking of moving to [place] to live with her adult son. With her express consent and in her presence  I spoke with her son on phone . He confirmed he plans to help her relocate to [facility] soon after discharge ( possibly on same week of discharge). Patient states she thinks she will feel much better there due to weather, and due to being closer to loved ones, to include son and grandchildren.  Diagnosis:  MDD , Panic Disorder, Consider Depression secondary to chronic medical illness   Total Time spent with patient: 25 minutes  ADL's:  Improving  Sleep: Good  Appetite: fair   Suicidal Ideation:  Denies SI at this time Homicidal Ideation:  Denies  AEB (as evidenced by):  Psychiatric Specialty Exam: Physical Exam  Constitutional: She is oriented to person, place, and time. She appears well-developed and well-nourished. No distress.  HENT:  Head: Normocephalic and atraumatic.  Neurological: She is alert and oriented to person, place, and time.  Coordination (reports feeling unsteady on feet ) abnormal.  Skin: Skin is warm and dry.  Psychiatric: She has a normal mood and affect. Her behavior is normal. Judgment and thought content normal.    Review of Systems  Constitutional: Positive for malaise/fatigue. Negative for fever, chills, weight loss and diaphoresis.  HENT: Positive for tinnitus. Negative for congestion, ear discharge, ear pain, hearing loss and nosebleeds.   Respiratory: Positive for cough. Negative for hemoptysis, shortness of breath, wheezing and stridor.   Cardiovascular: Negative for chest pain.  Gastrointestinal: Positive for nausea. Negative for heartburn, vomiting, abdominal pain, diarrhea and constipation.  Genitourinary: Negative for dysuria, urgency, frequency, hematuria and flank pain.  Musculoskeletal: Negative for myalgias, back pain, joint pain and neck pain.  Skin: Negative for itching and rash.  Neurological: Positive for dizziness. Negative for headaches.       Chronic  Endo/Heme/Allergies: Negative for environmental allergies and polydipsia. Does not bruise/bleed easily.  Psychiatric/Behavioral: Positive for depression. Negative for suicidal ideas. The patient is nervous/anxious.     Blood pressure 156/80, pulse 72, temperature 98.4 F (36.9 C), temperature source Oral, resp. rate 18, height 5' 6"  (1.676 m), weight 85.276 kg (188 lb).Body mass index is 30.36 kg/(m^2).  General Appearance: improved  Eye Contact::  Good  Speech:  Normal Rate  Volume:  Normal  Mood:  Depressed, but improved compared to admission   Affect:  Remains constricted, but is reactive   Thought Process:  Goal Directed and Linear  Orientation:  Full (Time, Place, and Person)  Thought Content:  no hallucinations, no delusions  Suicidal Thoughts:  No at this time denies any thoughts of hurting self and  contracts for safety on unit   Homicidal Thoughts:  No  Memory: Recent and Remote grossly intact  Judgement:  Good  Insight:   Present  Psychomotor Activity:  Decreased  Concentration:  Good  Recall:  Good  Fund of Knowledge:Good  Language: Good  Akathisia:  Negative  Handed:  Right  AIMS (if indicated):     Assets:  Communication Skills Desire for Improvement Resilience  Sleep:  Number of Hours: 6.5   Musculoskeletal: Strength & Muscle Tone: within normal limits Gait & Station: normal Patient leans: N/A  Current Medications: Current Facility-Administered Medications  Medication Dose Route Frequency Provider Last Rate Last Dose  . estrogens (conjugated) (PREMARIN) tablet 0.3 mg  0.3 mg Oral QPM Shuvon Rankin, NP   0.3 mg at 07/13/14 1834  . feeding supplement (ENSURE COMPLETE) (ENSURE COMPLETE) liquid 237 mL  237 mL Oral BID BM Janett Labella, NP   237 mL at 07/14/14 1055  . ferrous sulfate tablet 325 mg  325 mg Oral Q breakfast Shuvon Rankin, NP   325 mg at 07/14/14 0841  . FLUoxetine (PROZAC) capsule 40 mg  40 mg Oral Daily Jenne Campus, MD   40 mg at 07/14/14 0841  . fluticasone (FLONASE) 50 MCG/ACT nasal spray 2 spray  2 spray Each Nare Daily PRN Shuvon Rankin, NP      . ibuprofen (ADVIL,MOTRIN) tablet 600 mg  600 mg Oral Q8H PRN Shuvon Rankin, NP      . lisinopril (PRINIVIL,ZESTRIL) tablet 10 mg  10 mg Oral Daily Kennedy Bucker, NP   10 mg at 07/14/14 0841  . LORazepam (ATIVAN) tablet 0.5 mg  0.5 mg Oral TID Shuvon Rankin, NP   0.5 mg at 07/14/14 1156  . magnesium hydroxide (MILK OF MAGNESIA) suspension 30 mL  30 mL Oral Daily PRN Shuvon Rankin, NP      . meclizine (ANTIVERT) tablet 25 mg  25 mg Oral TID PRN Malena Peer, NP   25 mg at 07/14/14 0847  . mirtazapine (REMERON) tablet 30 mg  30 mg Oral QHS Sheila May Agustin, NP   30 mg at 07/13/14 2302  . nebivolol (BYSTOLIC) tablet 10 mg  10 mg Oral Daily Shuvon Rankin, NP   10 mg at 07/14/14 0841  . ondansetron (ZOFRAN) tablet 4 mg  4 mg Oral Q8H PRN Shuvon Rankin, NP      . ondansetron (ZOFRAN) tablet 4 mg  4 mg Oral Q6H Shuvon Rankin,  NP   4 mg at 07/14/14 0842  . pantoprazole (PROTONIX) EC tablet 40 mg  40 mg Oral Daily Shuvon Rankin, NP   40 mg at 07/14/14 4967    Lab Results: No results found for this or any previous visit (from the past 48 hour(s)).  Physical Findings: AIMS: Facial and Oral Movements Muscles of Facial Expression: None, normal Lips and Perioral Area: None, normal Jaw: None, normal Tongue: None, normal,Extremity Movements Upper (arms, wrists, hands, fingers): None, normal Lower (legs, knees, ankles, toes): None, normal, Trunk Movements Neck, shoulders, hips: None, normal, Overall Severity Severity of abnormal movements (highest score from questions above): None, normal Incapacitation due to abnormal movements: None, normal Patient's awareness of abnormal movements (rate only patient's report): No Awareness, Dental Status Current problems with teeth and/or dentures?: Yes Does patient usually wear dentures?: Yes  CIWA:    COWS:     Assessment : Still  depressed, but affect less constricted, less ruminative, and more future oriented. She is future oriented and seems to be excited about relocating to [place] soon after discharge in order to be closer to her son and grandchildren. Tolerating medications well. Treatment Plan Summary: Daily contact with patient to assess and evaluate symptoms and progress in treatment Medication management See below  Plan: 1. Continue inpatient treatment , milieu, support 2. Consider Discharge Soon as she continues to improve. 3. As discussed with treatment team/SW, team will communicate with son in order to help establish outpatient follow ups in [place], since patient will be moving there shortly after discharge. 2. Prozac  40  mgrs QAM  3. Ativan 0.5 mgrs TID  ( to address vertigo and anxiety)  4. Increase Remeron to 45 mgrs QHS 5. Meclizine and Zofran on PRN basis for dizziness/nausea 6.  Add Claritin  1 tab for c/o sinus pressure/seasonal allergies/  vertigo 7.  Added scopolamine 1.5 transdermal patch   Medical Decision Making Problem Points:  Established problem, stable/improving (1), Review of last therapy session (1) and Review of psycho-social stressors (1) Data Points:  Review of medication regiment & side effects (2) Review of new medications or change in dosage (2)  I certify that inpatient services furnished can reasonably be expected to improve the patient's condition.   Ashley Garnet  MD  07/14/2014, 1:17 PM  Berniece Andreas, MD

## 2014-07-14 NOTE — Progress Notes (Signed)
Adult Psychoeducational Group Note  Date:  07/14/2014 Time:  9:59 PM  Group Topic/Focus:  Wrap-Up Group:   The focus of this group is to help patients review their daily goal of treatment and discuss progress on daily workbooks.  Participation Level:  Did Not Attend  Additional Comments:  Pt is here for depression and do not usually attend groups.  Ashley Farmer 07/14/2014, 9:59 PM

## 2014-07-14 NOTE — Progress Notes (Signed)
Patient ID: Ashley Farmer, female   DOB: 07-28-1951, 63 y.o.   MRN: [id number] She has been in bed most of the day was up for one group and restroom. Observed her walking gait was steady normal, She did not c/o any dizziness at that time. She refuses to get up and go to meals.  Has had no c/o pain. Self inventory this AM: depression 8, hopelessness 7, anxiety 8. Withdrawals of agitation, nausea insomnia. SI thoughts sometimes, pain dizziness chest hurting from coughing, no coughing noted, Pain 3, no request for pain medication, goal : getting up not feeling so bad.

## 2014-07-14 NOTE — Progress Notes (Signed)
D: Pt appears to be flat in affect and depressed in mood. Pt affect did brighten upon interaction. Pt plans to move in with her son in [place]. Pt wants to return to her "old self". She is planning to go out more and enjoy life in general. Pt admits that she periodically have thoughts of SI. Pt remains open with staff about her SI at this time. Pt was isolative to her room this evening.  A: Writer administered scheduled medications to pt, per MD orders. Continued support and availability as needed was extended to this pt. Staff continue to monitor pt with q18min checks.  R: No adverse drug reactions noted. Pt receptive to treatment. Pt remains safe at this time.

## 2014-07-15 NOTE — Progress Notes (Signed)
D: Pt is blunted in affect and depressed in mood. Pt remained isolative to her room this evening. Pt reports an overall improvement in her overall mood.  A: Writer administered scheduled and prn medications to pt. Continued support and availability as needed was extended to this pt. Staff continue to monitor pt with q11min checks.  R: No adverse drug reactions noted. Pt receptive to treatment. Pt remains safe at this time.

## 2014-07-15 NOTE — Tx Team (Signed)
Interdisciplinary Treatment Plan Update (Adult) Date: 07/15/2014   Time Reviewed: 9:30 AM  Progress in Treatment: Attending groups: Minimally, patient reports feeling unwell due to vertigo Participating in groups: Yes, when she attends Taking medication as prescribed: Yes Tolerating medication: Yes Family/Significant other contact made: Yes, CSW has spoken with patient's son and sister Patient understands diagnosis: Yes Discussing patient identified problems/goals with staff: Yes Medical problems stabilized or resolved: Yes Denies suicidal/homicidal ideation: Yes, denies Issues/concerns per patient self-inventory: Yes Other:  New problem(s) identified: N/A  Discharge Plan or Barriers: Patient to discharge home with her sister and will move to [facility] with her son next week. She is agreeable to follow up with outpatient services, yet to be determined.  Reason for Continuation of Hospitalization:  Depression Anxiety Medication Stabilization   Comments: N/A  Estimated length of stay: Discharge anticipated tomorrow 07/16/14.  For review of initial/current patient goals, please see plan of care. Patient is a 63 year old African [place] female with a diagnosis of Generalized Anxiety Disorder with panic attacks and MDD, Single Episode, Moderate. Patient moved to [facility] to live with her sister and brother-in-law 4 years ago after being diagnosed with lung cancer. Patient reports having a strong relationship with her family but feels that they don't understand how her medical and mental health issues are impacting her. Patient reports feeling increased depression and anxiety/panic attacks over the past 1.5 months was also around the anniversary of her significant others' death a year ago. Patient reports experiencing a fear of being home alone and was experiencing SI on admission. Patient was seeing a counselor through [facility] (per patient) named [name]. She would like a  referral to therapy and medication management services. Her goals for treatment are to "get my medications right and get my anxiety under control to where I can function." Patient will benefit from crisis stabilization, medication evaluation, group therapy, and psycho education in addition to case management for discharge planning. Patient and CSW reviewed pt's identified goals and treatment plan. Pt verbalized understanding and agreed to treatment plan.  Attendees: Patient:    Family:    Physician: Dr. Parke Poisson; Dr. Sabra Heck 07/15/2014 9:30 AM  Nursing: Satira Sark, Janann August, Charlyne Quale, RN 07/15/2014 9:30 AM  Clinical Social Worker: Tilden Fossa,  [place] 07/15/2014 9:30 AM  Other: Joette Catching, LCSW 07/15/2014 9:30 AM  Other: Lucinda Dell, [place] Sessions Liaison 07/15/2014 9:30 AM  Other: Lars Pinks, Case Manager 12/1//2015 9:30 AM  Other:     Other:        Scribe for Treatment Team:  Tilden Fossa, MSW, [place] [phone]

## 2014-07-15 NOTE — [facility] Group Notes (Signed)
[facility] Group Notes:  (Nursing/MHT/Case Management/Adjunct)  Date:  07/15/2014  Time:  0845am  Type of Therapy:  Psychoeducational Skills  Participation Level:  Did Not Attend  Participation Quality:  Did not attend  Affect:  Did not attend  Cognitive:  Did not attend  Insight:  None  Engagement in Group:  None  Modes of Intervention:  Education and Support  Summary of Progress/Problems: Pt did not attend group  Jimmye Norman, [place] A 07/15/2014, 2:18 PM

## 2014-07-15 NOTE — Progress Notes (Signed)
Recreation Therapy Notes  Animal-Assisted Activity/Therapy (AAA/T) Program Checklist/Progress Notes Patient Eligibility Criteria Checklist & Daily Group note for Rec Tx Intervention  Date: 12.01.2015 Time: 2:45pm Location: [address] Film/video [address]    AAA/T Program Assumption of Risk Form signed by Patient/ or Parent Legal Guardian yes  Patient is free of allergies or sever asthma yes  Patient reports no fear of animals yes  Patient reports no history of cruelty to animals yes   Patient understands his/her participation is voluntary yes  Patient washes hands before animal contact yes  Patient washes hands after animal contact yes  Behavioral Response: Did not attend.   Laureen Ochs Wendelin Reader, LRT/CTRS  Kitzia Camus L 07/15/2014 5:03 PM

## 2014-07-15 NOTE — Progress Notes (Signed)
Patient ID: Ashley Farmer, female   DOB: July 10, 1951, 63 y.o.   MRN: [id number] [facility] MD Progress Note  07/15/2014 12:36 PM Ashley Farmer  MRN:  [id number] Subjective:   Patient reports improvement overall. States that with standing- rather than PRN- medication management for vertigo she feels less symptomatic. She also acknowledges she is feeling excited about upcoming relocation to [place], and is looking forward to seeing her son and grandchildren, and looking forward to  Better weather as well.   Objective:  I have discussed case with treatment team and have met with patient. Milieu participation is limited, but patient has been out of bed more often lately. Patient states that part of the reason for limited participation is not depression per se but  that she enjoys her privacy and to read and feel calm, and tends to feel more anxious in groups and milieu, especially due to construction/renovation on unit and subsequent noises and activity. At this time not anhedonic- as noted, she states she is enjoying reading, and smiled/lauged today as she spoke about her seeing her grandchildren soon. Denies medication side effects. Has chronic vertigo, but denies vomiting, is eating well, denies falls, and states symptoms are partially improved with current medication regimen.   Diagnosis:  MDD , Panic Disorder, Consider Depression secondary to chronic medical illness   Total Time spent with patient: 20 minutes  ADL's:  Improving  Sleep: Good  Appetite: fair   Suicidal Ideation:  Denies SI at this time Homicidal Ideation:  Denies  AEB (as evidenced by):  Psychiatric Specialty Exam: Physical Exam  Constitutional: She is oriented to person, place, and time. She appears well-developed and well-nourished. No distress.  HENT:  Head: Normocephalic and atraumatic.  Neurological: She is alert and oriented to person, place, and time. Coordination (reports feeling unsteady on feet ) abnormal.  Skin:  Skin is warm and dry.  Psychiatric: She has a normal mood and affect. Her behavior is normal. Judgment and thought content normal.    Review of Systems  Constitutional: Positive for malaise/fatigue. Negative for fever, chills, weight loss and diaphoresis.  HENT: Positive for tinnitus. Negative for congestion, ear discharge, ear pain, hearing loss and nosebleeds.   Respiratory: Positive for cough. Negative for hemoptysis, shortness of breath, wheezing and stridor.   Cardiovascular: Negative for chest pain.  Gastrointestinal: Positive for nausea. Negative for heartburn, vomiting, abdominal pain, diarrhea and constipation.  Genitourinary: Negative for dysuria, urgency, frequency, hematuria and flank pain.  Musculoskeletal: Negative for myalgias, back pain, joint pain and neck pain.  Skin: Negative for itching and rash.  Neurological: Positive for dizziness. Negative for headaches.       Chronic  Endo/Heme/Allergies: Negative for environmental allergies and polydipsia. Does not bruise/bleed easily.  Psychiatric/Behavioral: Positive for depression. Negative for suicidal ideas. The patient is nervous/anxious.     Blood pressure 136/81, pulse 73, temperature 98 F (36.7 C), temperature source Oral, resp. rate 18, height 5' 6"  (1.676 m), weight 85.276 kg (188 lb).Body mass index is 30.36 kg/(m^2).  General Appearance: improved  Eye Contact::  Good  Speech:  Normal Rate  Volume:  Normal  Mood:  improving , less depressed,   Affect:  Less constricted and more reactive   Thought Process:  Goal Directed and Linear  Orientation:  Full (Time, Place, and Person)  Thought Content:  no hallucinations, no delusions  Suicidal Thoughts:  No at this time denies any thoughts of hurting self and  contracts for safety on unit   Homicidal Thoughts:  No  Memory: Recent and Remote grossly intact  Judgement:  Good  Insight:  Present  Psychomotor Activity:  Decreased  Concentration:  Good  Recall:  Good   Fund of Knowledge:Good  Language: Good  Akathisia:  Negative  Handed:  Right  AIMS (if indicated):     Assets:  Communication Skills Desire for Improvement Resilience  Sleep:  Number of Hours: 6   Musculoskeletal: Strength & Muscle Tone: within normal limits Gait & Station: normal Patient leans: N/A  Current Medications: Current Facility-Administered Medications  Medication Dose Route Frequency Provider Last Rate Last Dose  . estrogens (conjugated) (PREMARIN) tablet 0.3 mg  0.3 mg Oral QPM Shuvon Rankin, NP   0.3 mg at 07/14/14 1709  . feeding supplement (ENSURE COMPLETE) (ENSURE COMPLETE) liquid 237 mL  237 mL Oral BID BM Janett Labella, NP   237 mL at 07/14/14 1055  . ferrous sulfate tablet 325 mg  325 mg Oral Q breakfast Shuvon Rankin, NP   325 mg at 07/15/14 0752  . FLUoxetine (PROZAC) capsule 40 mg  40 mg Oral Daily Jenne Campus, MD   40 mg at 07/15/14 0753  . fluticasone (FLONASE) 50 MCG/ACT nasal spray 2 spray  2 spray Each Nare Daily PRN Shuvon Rankin, NP      . ibuprofen (ADVIL,MOTRIN) tablet 600 mg  600 mg Oral Q8H PRN Shuvon Rankin, NP      . lisinopril (PRINIVIL,ZESTRIL) tablet 10 mg  10 mg Oral Daily Kennedy Bucker, NP   10 mg at 07/15/14 0753  . LORazepam (ATIVAN) tablet 0.5 mg  0.5 mg Oral TID Shuvon Rankin, NP   0.5 mg at 07/15/14 1126  . magnesium hydroxide (MILK OF MAGNESIA) suspension 30 mL  30 mL Oral Daily PRN Shuvon Rankin, NP      . meclizine (ANTIVERT) tablet 25 mg  25 mg Oral TID PRN Malena Peer, NP   25 mg at 07/15/14 1128  . mirtazapine (REMERON) tablet 45 mg  45 mg Oral QHS Jenne Campus, MD   45 mg at 07/14/14 2312  . nebivolol (BYSTOLIC) tablet 10 mg  10 mg Oral Daily Shuvon Rankin, NP   10 mg at 07/15/14 0752  . ondansetron (ZOFRAN) tablet 4 mg  4 mg Oral Q8H PRN Shuvon Rankin, NP      . ondansetron (ZOFRAN) tablet 4 mg  4 mg Oral Q6H Shuvon Rankin, NP   4 mg at 07/15/14 0753  . pantoprazole (PROTONIX) EC tablet 40 mg  40 mg Oral  Daily Shuvon Rankin, NP   40 mg at 07/15/14 6389    Lab Results: No results found for this or any previous visit (from the past 48 hour(s)).  Physical Findings: AIMS: Facial and Oral Movements Muscles of Facial Expression: None, normal Lips and Perioral Area: None, normal Jaw: None, normal Tongue: None, normal,Extremity Movements Upper (arms, wrists, hands, fingers): None, normal Lower (legs, knees, ankles, toes): None, normal, Trunk Movements Neck, shoulders, hips: None, normal, Overall Severity Severity of abnormal movements (highest score from questions above): None, normal Incapacitation due to abnormal movements: None, normal Patient's awareness of abnormal movements (rate only patient's report): No Awareness, Dental Status Current problems with teeth and/or dentures?: Yes Does patient usually wear dentures?: Yes  CIWA:    COWS:     Assessment : Gradual improvement of mood and affect, less constricted, less depressed, no SI, looking forward to seeing her soon and grandchildren, and expressing optimism and hope that upcoming relocation to [place] will  be  Beneficial for her. Tolerating medications well- denies side effects.  Treatment Plan Summary: Daily contact with patient to assess and evaluate symptoms and progress in treatment Medication management See below  Plan: 1. Continue inpatient treatment , milieu, support 2. Consider Discharge tomorrow as she continues to stabilize  3. Prozac  40  mgrs QAM  4. Ativan 0.5 mgrs TID  ( to address vertigo and anxiety)  5. Remeron  45 mgrs QHS 6. Meclizine and Zofran on PRN basis for dizziness/nausea    Medical Decision Making Problem Points:  Established problem, stable/improving (1), Review of last therapy session (1) and Review of psycho-social stressors (1) Data Points:  Review of medication regiment & side effects (2) Review of new medications or change in dosage (2)  I certify that inpatient services furnished can  reasonably be expected to improve the patient's condition.   Neita Garnet  MD  07/15/2014, 12:36 PM  Berniece Andreas, MD

## 2014-07-15 NOTE — [facility] Group Notes (Signed)
[place] LCSW Group Therapy  07/15/2014   1:15 PM   Type of Therapy:  Group Therapy  Participation Level:  Active  Participation Quality:  Attentive, Sharing and Supportive  Affect:  Depressed and Flat  Cognitive:  Alert and Oriented  Insight:  Developing/Improving and Engaged  Engagement in Therapy:  Developing/Improving and Engaged  Modes of Intervention:  Clarification, Confrontation, Discussion, Education, Exploration, Limit-setting, Orientation, Problem-solving, Rapport Building, Art therapist, Socialization and Support  Summary of Progress/Problems: The topic for group therapy was feelings about diagnosis.  Pt actively participated in group discussion on their past and current diagnosis and how they feel towards this.  Pt also identified how society and family members judge them, based on their diagnosis as well as stereotypes and stigmas.  Patient actively listened during group but did not share on topic.  Tilden Fossa, MSW, [place] Worker [facility] [phone]

## 2014-07-15 NOTE — Progress Notes (Signed)
Patient ID: Ashley Farmer, female   DOB: 11-Jun-1951, 63 y.o.   MRN: [id number] She has been in bed most of the day did get up for one group. Ambulation stable no c/o  Dizziness. Self inventory: Depression 7. Hopelessness 7, anxiety 8, denies withdrawals. Denies SI today.  Continues to c/o light headness and dizziness today. Goal:  Have no clue, not feeling well today.

## 2014-07-15 NOTE — Progress Notes (Signed)
Patient ID: Ashley Farmer, female   DOB: Nov 09, 1950, 63 y.o.   MRN: [id number] D: Client in bed this evening reading, reports anxiety and depression at "5" of 10. Client relates anxiety to discharge plans and arrangements to see other providers, "its a lot"  Client will be relocating to live with son [place]. A: Writer introduced self to client, provided emotional support encouraged client to speak to CM about any concerns she may have about discharge. Staff will monitor q80min for safety. R: Client is safe on the unit, did not attend group.

## 2014-07-15 NOTE — Clinical Social Work Note (Signed)
CSW made follow up appointments for patient in [place] and updated her sister and son on patient's discharge plans.  Tilden Fossa, MSW, [place] Worker [facility] [phone]

## 2014-07-15 NOTE — Progress Notes (Signed)
Adult Psychoeducational Group Note  Date:  07/15/2014 Time:  9:05 PM  Group Topic/Focus:  Wrap-Up Group:   The focus of this group is to help patients review their daily goal of treatment and discuss progress on daily workbooks.  Participation Level:  Did Not Attend   Additional Comments: Pt usually do not attend wrap-up night groups.  Priscila Bean, [place] 07/15/2014, 9:05 PM

## 2014-07-16 MED ORDER — MIRTAZAPINE 45 MG PO TABS: 45.0000 mg | ORAL_TABLET | Freq: Every day | ORAL | Status: AC

## 2014-07-16 MED ORDER — FLUOXETINE HCL 40 MG PO CAPS: 40.0000 mg | ORAL_CAPSULE | Freq: Every day | ORAL | Status: AC

## 2014-07-16 MED ORDER — LISINOPRIL 10 MG PO TABS: 10.0000 mg | ORAL_TABLET | Freq: Every day | ORAL | Status: AC

## 2014-07-16 MED ORDER — ESTROGENS CONJUGATED 0.3 MG PO TABS: 0.3000 mg | ORAL_TABLET | Freq: Every evening | ORAL | Status: AC

## 2014-07-16 MED ORDER — OMEPRAZOLE 20 MG PO CPDR: 20.0000 mg | DELAYED_RELEASE_CAPSULE | Freq: Every day | ORAL | Status: AC

## 2014-07-16 MED ORDER — FERROUS SULFATE 325 (65 FE) MG PO TABS: 325.0000 mg | ORAL_TABLET | Freq: Every day | ORAL | Status: AC

## 2014-07-16 MED ORDER — ONDANSETRON HCL 4 MG PO TABS: 4.0000 mg | ORAL_TABLET | Freq: Four times a day (QID) | ORAL | Status: AC

## 2014-07-16 MED ORDER — NEBIVOLOL HCL 10 MG PO TABS: 10.0000 mg | ORAL_TABLET | Freq: Every day | ORAL | Status: AC

## 2014-07-16 MED ORDER — LORAZEPAM 0.5 MG PO TABS: 0.5000 mg | ORAL_TABLET | Freq: Three times a day (TID) | ORAL | Status: AC

## 2014-07-16 NOTE — Clinical Social Work Note (Signed)
CSW met with patient to discuss discharge home today. Patient reports feeling "okay" today and ready to discharge back home with family. Patient has no further questions at this time.  Tilden Fossa, MSW, [place] Worker [facility] [phone]

## 2014-07-16 NOTE — Discharge Summary (Addendum)
Physician Discharge Summary Note  Patient:  Ashley Farmer is an 63 y.o., female MRN:  [id number] DOB:  May 16, 1951 Patient phone:  [phone] (home)  Patient address:   [address] [place] [address],  Total Time spent with patient: 30 minutes  Date of Admission:  07/02/2014 Date of Discharge: 07/16/14  Reason for Admission:  Severe Depression  Discharge Diagnoses: Principal Problem:   MDD (major depressive disorder), single episode, severe , no psychosis Active Problems:   Benign positional vertigo  Psychiatric Specialty Exam: Physical Exam  Review of Systems  Constitutional: Negative.   HENT: Negative.   Eyes: Negative.   Respiratory: Negative.   Cardiovascular: Negative.   Gastrointestinal: Negative.   Genitourinary: Negative.   Musculoskeletal: Negative.   Skin: Negative.   Neurological: Positive for dizziness (Managed wtih prn medications. ).  Endo/Heme/Allergies: Negative.   Psychiatric/Behavioral: Positive for depression (Stabilized with treatment) and suicidal ideas (Stabilized with treatment).    Blood pressure 156/73, pulse 66, temperature 98 F (36.7 C), temperature source Oral, resp. rate 20, height 5\' 6"  (1.676 m), weight 85.276 kg (188 lb).Body mass index is 30.36 kg/(m^2).  See Physician SRA     Past Psychiatric History: See H&P Diagnosis:  Hospitalizations:  Outpatient Care:  Substance Abuse Care:  Self-Mutilation:  Suicidal Attempts:  Violent Behaviors:   Musculoskeletal: Strength & Muscle Tone: within normal limits Gait & Station: normal Patient leans: N/A  DSM5:  AXIS I: MDD without psychotic symptoms, Depression Secondary to Medical Illness  AXIS II: Deferred AXIS III:  Past Medical History  Diagnosis Date  . lung ca     lung ca  . Iron deficiency anemia   . Hypertension   . GERD (gastroesophageal reflux disease)   . Hiatal hernia   . Positional vertigo   . Anxiety   . Panic attacks     AXIS IV: occupational problems and medical issues/malignancy AXIS V: 60-65 upon discharge  Level of Care:  OP  Hospital Course:  Ashley Farmer is a 63 year old female who has been experiencing worsening depression and anxiety, particularly over the last month or two. She states that medical issues may be contributing to her depression and anxiety. A few years ago she was diagnosed with lung cancer, and she states she was told that it is not curable.She developed some suicidal ideations , but did not have any specific plan.She describes panic attacks and has developed a fear of being in crowds and in public, has been isolating at home.         Ashley Farmer was admitted to the adult unit where she was evaluated and her symptoms were identified. Medication management was discussed and implemented. Her Remeron from prior to admission was increased to 45 mg to help with depressive symptoms. Patient was also started on Prozac 40 mg daily for treatment of depression, which was gradually increased to target the symptoms. Patient was ordered Ativan 0.5 mg TID to help with anxiety and also symptoms of vertigo.She was encouraged to participate in unit programming. Medical problems were identified and treated appropriately. Home medication was restarted as needed.  She was evaluated each day by a clinical provider to ascertain the patient's response to treatment.  Improvement was noted by the patient's report of decreasing symptoms, improved sleep and appetite, affect, medication tolerance, behavior, and participation in unit programming.  The patient was asked each day to complete a self inventory noting mood, mental status, pain, new symptoms, anxiety and concerns.  She responded well to medication and being in a therapeutic and supportive environment. The patient was slow to become involved in unit activities due to depressive symptoms and trouble with her vertigo. Patient states that part of the reason  for limited participation is not depression per se but that she enjoys her privacy and to read and feel calm, and tends to feel more anxious in groups and milieu, especially due to construction/renovation on unit and subsequent noises and activity. Positive and appropriate behavior was noted and the patient was motivated for recovery.  She worked closely with the treatment team and case manager to develop a discharge plan with appropriate goals. Coping skills, problem solving as well as relaxation therapies were also part of the unit programming.         By the day of discharge she was in much improved condition than upon admission.  Symptoms were reported as significantly decreased or resolved completely. The patient denied SI/HI and voiced no AVH. She was motivated to continue taking medication with a goal of continued improvement in mental health.   Ashley Farmer was discharged home with a plan to follow up as noted below. Patient was provided with prescriptions and sample medications. She will be returning home with her sister before relocating to [place] with her son. Patient has psychiatric follow up scheduled in [place], [place].   Consults:  psychiatry  Significant Diagnostic Studies:  Chemistry panel, CBC, UDS negative  Discharge Vitals:   Blood pressure 156/73, pulse 66, temperature 98 F (36.7 C), temperature source Oral, resp. rate 20, height 5\' 6"  (1.676 m), weight 85.276 kg (188 lb). Body mass index is 30.36 kg/(m^2). Lab Results:   No results found for this or any previous visit (from the past 72 hour(s)).  Physical Findings: AIMS: Facial and Oral Movements Muscles of Facial Expression: None, normal Lips and Perioral Area: None, normal Jaw: None, normal Tongue: None, normal,Extremity Movements Upper (arms, wrists, hands, fingers): None, normal Lower (legs, knees, ankles, toes): None, normal, Trunk Movements Neck, shoulders, hips: None, normal, Overall Severity Severity of  abnormal movements (highest score from questions above): None, normal Incapacitation due to abnormal movements: None, normal Patient's awareness of abnormal movements (rate only patient's report): No Awareness, Dental Status Current problems with teeth and/or dentures?: Yes Does patient usually wear dentures?: Yes  CIWA:    COWS:     Psychiatric Specialty Exam: See Psychiatric Specialty Exam and Suicide Risk Assessment completed by Attending Physician prior to discharge.  Discharge destination:  Home  Is patient on multiple antipsychotic therapies at discharge:  No   Has Patient had three or more failed trials of antipsychotic monotherapy by history:  No  Recommended Plan for Multiple Antipsychotic Therapies: NA  Discharge Instructions    Discharge instructions    Complete by:  As directed   Please scheduled to follow up with a Primary Care Provider in [place] for further management of medical problems such as Hypertension. You were started on Lisinopril 10 mg daily during your stay and provided with a prescription to last thirty days.            Medication List    TAKE these medications      Indication   estrogens (conjugated) 0.3 MG tablet  Commonly known as:  PREMARIN  Take 1 tablet (0.3 mg total) by mouth every evening.   Indication:  Deficiency of the Hormone Estrogen     ferrous sulfate 325 (65 FE) MG tablet  Take 1 tablet (  325 mg total) by mouth daily with breakfast.   Indication:  Iron Deficiency     FLUoxetine 40 MG capsule  Commonly known as:  PROZAC  Take 1 capsule (40 mg total) by mouth daily.   Indication:  Depression, Panic Disorder     fluticasone 50 MCG/ACT nasal spray  Commonly known as:  FLONASE  Place 2 sprays into both nostrils daily as needed for allergies or rhinitis (rhinitis).      lisinopril 10 MG tablet  Commonly known as:  PRINIVIL,ZESTRIL  Take 1 tablet (10 mg total) by mouth daily.   Indication:  High Blood Pressure     LORazepam 0.5  MG tablet  Commonly known as:  ATIVAN  Take 1 tablet (0.5 mg total) by mouth 3 (three) times daily.   Indication:  Panic Disorder, anxiety     meclizine 25 MG tablet  Commonly known as:  ANTIVERT  Take 25 mg by mouth 3 (three) times daily as needed for dizziness (dizziness). For dizziness      mirtazapine 45 MG tablet  Commonly known as:  REMERON  Take 1 tablet (45 mg total) by mouth at bedtime.   Indication:  Trouble Sleeping, Major Depressive Disorder     nebivolol 10 MG tablet  Commonly known as:  BYSTOLIC  Take 1 tablet (10 mg total) by mouth daily.   Indication:  High Blood Pressure     omeprazole 20 MG capsule  Commonly known as:  PRILOSEC  Take 1 capsule (20 mg total) by mouth daily with breakfast.   Indication:  Gastroesophageal Reflux Disease     ondansetron 4 MG tablet  Commonly known as:  ZOFRAN  Take 1 tablet (4 mg total) by mouth every 6 (six) hours.   Indication:  nausea           Follow-up Information    [facility] up with [facility] On 07/30/2014.   Why:  Therapy appointment on Wednesday, Dec. 16th at 11 am. Please call office if you need to reschedule. Please contact office with credit card information to complete appointment booking.   Contact information:   [address] [place] [address] Phone: [phone] Fax: [phone]      Follow up with [facility] On 08/28/2014.   Why:  Medication management appointment on Thursday, Jan. 14th at 11 am. Please call office if you need to reschedule. Please contact office with credit card information to complete appointment booking.   Contact information:   [address] [place] [address] Phone: [phone] Fax: [phone]     Follow-up recommendations:   Activity: As tolerated Diet: Low sodium Tests: NA Other: See below  Comments:   Take all your medications as prescribed by your mental healthcare  provider.  Report any adverse effects and or reactions from your medicines to your outpatient provider promptly.  Patient is instructed and cautioned to not engage in alcohol and or illegal drug use while on prescription medicines.  In the event of worsening symptoms, patient is instructed to call the crisis hotline, 911 and or go to the nearest ED for appropriate evaluation and treatment of symptoms.  Follow-up with your primary care provider for your other medical issues, concerns and or health care needs.   Total Discharge Time:  Greater than 30 minutes.  SignedElmarie Shiley NP-C 07/16/2014, 10:57 AM   Patient seen, Suicide Assessment Completed.  Disposition Plan Reviewed Patient seen, Suicide Assessment Completed.

## 2014-07-16 NOTE — [facility] Suicide Risk Assessment (Signed)
Demographic Factors:  63 year old female, single, living with sister  Total Time spent with patient: 30 minutes  Psychiatric Specialty Exam: Physical Exam  ROS  Blood pressure 156/73, pulse 66, temperature 98 F (36.7 C), temperature source Oral, resp. rate 20, height 5\' 6"  (1.676 m), weight 85.276 kg (188 lb).Body mass index is 30.36 kg/(m^2).  General Appearance: Well Groomed  Engineer, water::  Good  Speech:  Normal Rate  Volume:  Decreased  Mood:  improved, less depressed  Affect:  Appropriate  Thought Process:  Goal Directed and Linear  Orientation:  Full (Time, Place, and Person)  Thought Content:  no hallucinations, no delusions, future oriented, excited about relocating to [facility] soon. Less ruminative about physical health issues  Suicidal Thoughts:  No- denies suicidal or homicidal ideations at present and contracts for safety  Homicidal Thoughts:  No  Memory:  Recent and Remote grossly intact  Judgement:  Other:  improved  Insight:  Present  Psychomotor Activity:  Decreased- but has improved since admission  Concentration:  Good  Recall:  Good  Fund of Knowledge:Good  Language: Good  Akathisia:  Negative  Handed:  Right  AIMS (if indicated):     Assets:  Communication Skills Desire for Improvement Resilience Social Support  Sleep:  Number of Hours: 6.5    Musculoskeletal: Strength & Muscle Tone: within normal limits Gait & Station: normal Patient leans: N/A   Mental Status Per Nursing Assessment::   On Admission:  Self-harm thoughts  Current Mental Status by Physician: As noted above, at this time patient is improved compared to her admission status. She is presenting with improved mood, decreased depression, improved range of affect, decreased ruminations, and is more future oriented and optimistic. She is denying any current SI or HI, and has no psychotic symptoms.  Loss Factors: Decrease in vocational status and  lung malignancy, that has been  reported as inoperable  Historical Factors: Depression. Anxiety. History of Lung Cancer, diagnosed in 2011, but which has reportedly not progressed significantly since then. Patient also has a history of chronic vertigo.  Risk Reduction Factors:   Sense of responsibility to family, Living with another person, especially a relative, Positive social support and Positive coping skills or problem solving skills  Continued Clinical Symptoms:  As noted, currently improved compared to admission. Not suicidal or homicidal or psychotic and future oriented.  Cognitive Features That Contribute To Risk:  No gross cognitive deficits noted upon discharge. Is alert , attentive, and oriented x 3   Suicide Risk:  Mild:  Suicidal ideation of limited frequency, intensity, duration, and specificity.  There are no identifiable plans, no associated intent, mild dysphoria and related symptoms, good self-control (both objective and subjective assessment), few other risk factors, and identifiable protective factors, including available and accessible social support.  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 lung ca     lung ca  . Iron deficiency anemia   . Hypertension   . GERD (gastroesophageal reflux disease)   . Hiatal hernia   . Positional vertigo   . Anxiety   . Panic attacks    AXIS IV:  occupational problems and medical issues/malignancy AXIS V:  60-65 upon discharge  Plan Of Care/Follow-up recommendations:  Activity:  As tolerated Diet:  Low sodium Tests:  NA Other:  See below  Is patient on  multiple antipsychotic therapies at discharge:  No   Has Patient had three or more failed trials of antipsychotic monotherapy by history:  No  Recommended Plan for Multiple Antipsychotic Therapies: NA  Patient is leaving unit in good spirits. She plans to stay  with sister over the next two to three days, at which time her son will pick her up and she will relocate to [place], [place]. Patient states she has made appointment with a psychiatrist there for ongoing outpatient treatment , and plans to set up treatment with an oncologist and a PCP as well. She does plan to see Dr. Imagene Gurney, her local oncologist, later this week , before she relocates.   Ashley Farmer 07/16/2014, 1:33 PM

## 2014-07-16 NOTE — Progress Notes (Signed)
[facility] Adult Case Management Discharge Plan :  Will you be returning to the same living situation after discharge: Yes,  patient will return home with her sister before relocating to [facility] with her son At discharge, do you have transportation home?:Yes,  patient's sister to provide transportation Do you have the ability to pay for your medications:Yes,  patient will be provided prescriptions at discharge  Release of information consent forms completed and in the chart;  Patient's signature needed at discharge.  Patient to Follow up at: Follow-up Information    Follow up with [facility] On 07/30/2014.   Why:  Therapy appointment on Wednesday, Dec. 16th at 11 am. Please call office if you need to reschedule. Please contact office with credit card information to complete appointment booking.   Contact information:   [address] [place] [address] Phone: [phone] Fax: [phone]      Follow up with [facility] On 08/28/2014.   Why:  Medication management appointment on Thursday, Jan. 14th at 11 am. Please call office if you need to reschedule. Please contact office with credit card information to complete appointment booking.   Contact information:   [address] [place] [address] Phone: [phone] Fax: [phone]      Patient denies SI/HI:   Yes,  denies    Safety Planning and Suicide Prevention discussed:  Yes,  with patient, sister, and son  Richardo Priest 07/16/2014, 10:29 AM

## 2014-07-16 NOTE — [facility] Group Notes (Signed)
[facility] LCSW Aftercare Discharge Planning Group Note  07/16/2014  8:45 AM  Participation Quality: Did Not Attend- patient awake but in bed.  Tilden Fossa, MSW, [place] Worker [facility] [phone]

## 2014-07-16 NOTE — [facility] Group Notes (Signed)
[facility] LCSW Group Therapy 07/16/2014  1:15 PM   Type of Therapy: Group Therapy  Participation Level: Did Not Attend.   Tilden Fossa, MSW, [place] Worker [facility] [phone]

## 2014-07-16 NOTE — Progress Notes (Signed)
Discharge Note: Discharge instructions/prescriptions/medication samples given to patient. Patient verbalized understanding of discharge instructions and prescriptions. Returned belongings to patient. Denies SI/HI/AVH. Patient d/c without incident to the lobby and transported home by her sister.

## 2014-07-18 NOTE — Progress Notes (Signed)
Patient Discharge Instructions:  After Visit Summary (AVS):   Faxed to:  07/18/14 Discharge Summary Note:   Faxed to:  07/18/14 Psychiatric Admission Assessment Note:   Faxed to:  07/18/14 Suicide Risk Assessment - Discharge Assessment:   Faxed to:  07/18/14 Faxed/Sent to the Next Level Care provider:  07/18/14 Faxed to [place] @ [place], 07/18/2014, 3:22 PM

## 2014-08-12 ENCOUNTER — Other Ambulatory Visit: Payer: Medicare Other

## 2014-08-12 ENCOUNTER — Telehealth: Payer: Self-pay | Admitting: Oncology

## 2014-08-12 ENCOUNTER — Ambulatory Visit: Payer: Medicare Other | Admitting: Oncology

## 2014-08-12 NOTE — Telephone Encounter (Signed)
pt cld & wanted to CX appt-stated she will call back to r/s

## 2014-09-08 ENCOUNTER — Telehealth: Payer: Self-pay | Admitting: Oncology

## 2014-09-08 NOTE — Telephone Encounter (Signed)
Faxed pt medical records to [facility] [place] in [phone]

## 2022-08-08 ENCOUNTER — Emergency Department: Admit: 2022-08-08 | Payer: MEDICARE

## 2022-08-08 ENCOUNTER — Inpatient Hospital Stay
Admit: 2022-08-08 | Discharge: 2022-08-08 | Disposition: A | Discharge status: Home / Self Care | Payer: MEDICARE | Attending: Emergency Medicine

## 2022-08-08 DIAGNOSIS — U071 COVID-19: Secondary | ICD-10-CM

## 2022-08-08 DIAGNOSIS — R42 Dizziness and giddiness: Secondary | ICD-10-CM

## 2022-08-08 LAB — COMPREHENSIVE METABOLIC PANEL
ALT: 19 U/L (ref 13–56)
AST: 22 U/L (ref 10–38)
Albumin/Globulin Ratio: 0.9 (ref 0.8–1.7)
Albumin: 3.5 g/dL (ref 3.4–5.0)
Alk Phosphatase: 58 U/L (ref 45–117)
Anion Gap: 8 mmol/L (ref 3.0–18)
BUN: 11 MG/DL (ref 7.0–18)
Bun/Cre Ratio: 8 — ABNORMAL LOW (ref 12–20)
CO2: 27 mmol/L (ref 21–32)
Calcium: 9.3 MG/DL (ref 8.5–10.1)
Chloride: 103 mmol/L (ref 100–111)
Creatinine: 1.37 MG/DL — ABNORMAL HIGH (ref 0.6–1.3)
Est, Glom Filt Rate: 41 mL/min/{1.73_m2} — ABNORMAL LOW (ref >60)
Globulin: 4.1 g/dL — ABNORMAL HIGH (ref 2.0–4.0)
Glucose: 119 mg/dL — ABNORMAL HIGH (ref 74–99)
Potassium: 3.7 mmol/L (ref 3.5–5.5)
Sodium: 138 mmol/L (ref 136–145)
Total Bilirubin: 0.7 MG/DL (ref 0.2–1.0)
Total Protein: 7.6 g/dL (ref 6.4–8.2)

## 2022-08-08 LAB — CBC WITH AUTO DIFFERENTIAL
Absolute Immature Granulocyte: 0 10*3/uL (ref 0.00–0.04)
Basophils %: 0 % (ref 0–2)
Basophils Absolute: 0 10*3/uL (ref 0.0–0.1)
Eosinophils %: 1 % (ref 0–5)
Eosinophils Absolute: 0.1 10*3/uL (ref 0.0–0.4)
Hematocrit: 34.7 % — ABNORMAL LOW (ref 35.0–45.0)
Hemoglobin: 10.1 g/dL — ABNORMAL LOW (ref 12.0–16.0)
Immature Granulocytes: 0 % (ref 0.0–0.5)
Lymphocytes %: 39 % (ref 21–52)
Lymphocytes Absolute: 3.4 10*3/uL (ref 0.9–3.6)
MCH: 22.1 PG — ABNORMAL LOW (ref 24.0–34.0)
MCHC: 29.1 g/dL — ABNORMAL LOW (ref 31.0–37.0)
MCV: 75.8 FL — ABNORMAL LOW (ref 78.0–100.0)
MPV: 10.1 FL (ref 9.2–11.8)
Monocytes %: 7 % (ref 3–10)
Monocytes Absolute: 0.6 10*3/uL (ref 0.05–1.2)
Neutrophils %: 52 % (ref 40–73)
Neutrophils Absolute: 4.4 10*3/uL (ref 1.8–8.0)
Nucleated RBCs: 0 PER 100 WBC
Platelets: 427 10*3/uL — ABNORMAL HIGH (ref 135–420)
RBC: 4.58 M/uL (ref 4.20–5.30)
RDW: 17.4 % — ABNORMAL HIGH (ref 11.6–14.5)
WBC: 8.6 10*3/uL (ref 4.6–13.2)
nRBC: 0 10*3/uL (ref 0.00–0.01)

## 2022-08-08 LAB — POCT GLUCOSE: POC Glucose: 119 mg/dL — ABNORMAL HIGH (ref 70–110)

## 2022-08-08 LAB — MAGNESIUM: Magnesium: 2.1 mg/dL (ref 1.6–2.6)

## 2022-08-08 LAB — TROPONIN: Troponin, High Sensitivity: 10 ng/L (ref 0–54)

## 2022-08-08 LAB — EKG 12-LEAD
Atrial Rate: 98 {beats}/min
Diagnosis: NORMAL
P Axis: 46 degrees
P-R Interval: 142 ms
Q-T Interval: 348 ms
QRS Duration: 78 ms
QTc Calculation (Bazett): 444 ms
R Axis: 40 degrees
T Axis: 43 degrees
Ventricular Rate: 98 {beats}/min

## 2022-08-08 MED ORDER — SODIUM CHLORIDE 0.9 % IV BOLUS
0.9 % | Freq: Once | INTRAVENOUS | Status: AC
Start: 2022-08-08 — End: 2022-08-08
  Administered 2022-08-08: 18:00:00 1000 mL via INTRAVENOUS

## 2022-08-08 MED FILL — SODIUM CHLORIDE 0.9 % IV SOLN: 0.9 % | INTRAVENOUS | Qty: 1000

## 2022-08-08 NOTE — ED Notes (Signed)
[phone] - Patient does not want to go home as she is afraid that she will die. Tried to explain to her the results but patient still does not want to go home. Informed Dr. Derrel Nip.  [address] [place] to patient's room with Dr. Derrel Nip; Dr. Derrel Nip re-explained the result and our role as emergency department but patient is still afraid to go home. Will try to contact patient's sister.

## 2022-08-08 NOTE — ED Provider Notes (Signed)
[facility] EMERGENCY DEPT  eMERGENCY dEPARTMENT eNCOUnter        Pt Name: Renee Mcgee  MRN: [id number]  Birthdate 05-31-1951  Date of evaluation: 08/08/2022  Provider: Lesia Sago, MD  PCP: No primary care provider on file.  Note Started: 1:08 PM EST 08/08/2022          CHIEF COMPLAINT       Chief Complaint   Patient presents with    Dizziness       HISTORY OFPRESENT ILLNESS        Patient coming in with concerns of generalized weakness over the last month, worse over the last 7 days or so.  She was seen at [facility] on 19 December and found to had COVID-19.  She was discharged on symptomatic medications but no Paxlovid.  Comes in today because she had an episode of lightheadedness when walking today.  Felt like she was going to pass out.  No fevers.  No chest pain or shortness of breath.  No vomiting or diarrhea.  She is convinced that there is something more going on than COVID-19.    Nursing Notes were all reviewed and agreed with or any disagreements were addressed  in the HPI.    REVIEW OF SYSTEMS               PASTMEDICAL HISTORY   No past medical history on file.      SURGICAL HISTORY     No past surgical history on file.      CURRENT MEDICATIONS       Previous Medications    No medications on file       ALLERGIES     Patient has no known allergies.    FAMILY HISTORY     No family history on file.       SOCIAL HISTORY       Social History     Socioeconomic History    Marital status: Single       SCREENINGS        Glasgow Coma Scale  Eye Opening: Spontaneous  Best Verbal Response: Oriented  Best Motor Response: Obeys commands  Glasgow Coma Scale Score: 15                CIWA Assessment  BP: 135/60  Pulse: 99             PHYSICAL EXAM         ED Triage Vitals [08/08/22 1243]   BP Temp Temp Source Pulse Respirations SpO2 Height Weight - Scale   135/60 98.2 F (36.8 C) Oral 99 18 96 % 1.676 m (5\' 6" ) 81.6 kg (180 lb)     CONST: appears comfortable. VS are noted  HEAD: NC/AT  EYES: PERRL. EOMI. Conjunctiva  normal bilaterally. Sclera anicteric  ENT: NP - no discharge or epistaxis. OP - mmm. No erythema or exudate.  NECK: supple. No significant lymphadenopathy. No midline tenderness, crepitus or step-off.  CV: RRR. No M,R,G. 2+ radial and pedal pulses bilaterally.  RESPIRATORY CHEST: CTAB, no wheezes, rales or rhonchi. No increased work of breathing. No chestwall TTP, crepitus or stepoff.   ABD: soft, NT, ND, NABS. No HSM  GU: deferred  BACK: No midline tenderness, crepitus, or step-off. No CVAT  UPPER EXTREMITY: No deformity, clubbing, cyanosis, edema.   LOWER EXTREMITY: No deformity, clubbing, cyanosis, edema. Equal calf circumference bilaterally.   LYMPHATIC: No cervical lymphadenopathy.   NEURO: Normal mental status, grossly non-focal.   SKIN: Warm  and dry. No rashes.   PSYCHIATRIC: Oriented x 3. Normal affect, judgment, insight and concentration.       DIAGNOSTIC RESULTS   LABS:    Labs Reviewed   CBC WITH AUTO DIFFERENTIAL - Abnormal; Notable for the following components:       Result Value    Hemoglobin 10.1 (*)     Hematocrit 34.7 (*)     MCV 75.8 (*)     MCH 22.1 (*)     MCHC 29.1 (*)     RDW 17.4 (*)     Platelets 427 (*)     All other components within normal limits   COMPREHENSIVE METABOLIC PANEL - Abnormal; Notable for the following components:    Glucose 119 (*)     Creatinine 1.37 (*)     Bun/Cre Ratio 8 (*)     Est, Glom Filt Rate 41 (*)     Globulin 4.1 (*)     All other components within normal limits   POCT GLUCOSE - Abnormal; Notable for the following components:    POC Glucose 119 (*)     All other components within normal limits   MAGNESIUM   TROPONIN       All other labs were within normal range or not returned as of thisdictation.      IMAGING  XR CHEST PORTABLE   Final Result   1.  Hypoinflation with mild streaky bibasilar atelectasis                  CT CHEST ABDOMEN PELVIS W CONTRAST    Result Date: 08/02/2022  EXAM: CT ABD/PELVIS-IV ONLY CLINICAL INDICATION/HISTORY: Provided with order -    Abdominal pain, acute, nonlocalized   > Additional: None COMPARISON: CT performed 02/20/2010 TECHNIQUE: Helical CT imaging of the abdomen and pelvis was performed with intravenous contrast. Multiplanar reformats were generated. One or more dose reduction techniques were used on this CT: automated exposure control, adjustment of the mAs and/or kVp according to patient size, and iterative reconstruction techniques.  The specific techniques used on this CT exam have been documented in the patient's electronic medical record.  Digital Imaging and Communications in Medicine (DICOM) format image data are available to nonaffiliated external healthcare facilities or entities on a secure, media free, reciprocally searchable basis with patient authorization for at least a 7542-month period after this study. _______________ FINDINGS: LOWER CHEST: There is a large hiatal hernia with volume loss and atelectasis in the lung bases as well as numerous pulmonary nodules. Please see separately dictated CT of the chest. LIVER, BILIARY: Liver is normal. No biliary dilation. Gallbladder is surgically absent. PANCREAS: Normal. SPLEEN: Normal. 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 KIDNEYS/URETERS/BLADDER: Abnormal heterogeneously enhancing mass with central low attenuation arising from the upper pole of the left kidney measures approximately 2.1 x 1.8 x 2.2 cm on axial series image 49 and coronal series image 66. There is a smaller heterogeneously enhancing lesion in the left kidney measuring 1.2 cm, not clearly cystic. Several additional subcentimeter hypodensities bilaterally are most likely benign. PELVIC ORGANS: Unremarkable. LYMPH NODES: No enlarged lymph nodes. GASTROINTESTINAL TRACT: No bowel dilation or wall thickening. VASCULATURE: Unremarkable. BONES: No acute or aggressive osseous abnormalities identified. Lower lumbar spondylosis. OTHER: None.  _______________    1. No acute inflammatory process in the abdomen or pelvis. 2. 2 left renal masses with the larger in the upper pole, concerning for RCC. A smaller lesion  in the lower pole is also indeterminate however, additional RCC is not excluded. Recommend further characterization with dedicated renal mass CT or MRI. 3. Pulmonary nodules. Please see separately dictated CT of the chest. 4. Large hiatal hernia. 5. Additional chronic/ancillary findings. Signed By: Lorriane Shire, MD on 08/02/2022 8:20 PM    CTA PULMONARY W CONTRAST    Result Date: 08/02/2022  EXAM: CT CTA CHEST PULMONARY CLINICAL INDICATION/HISTORY: Provided with order -   Pulmonary embolism (PE) suspected, unknown D-dimer   > Additional: Chest pain and shortness of breath. COMPARISON: Chest CT 01/13/2010 TECHNIQUE: Helical CT imaging from the thoracic inlet through the diaphragm with intravenous contrast. Coronal and axial MIP reformats were generated. One or more dose reduction techniques were used on this CT: automated exposure control, adjustment of the mAs and/or kVp according to patient size, and iterative reconstruction techniques. The specific techniques used on this CT exam have been documented in the patient's electronic medical record.  Digital Imaging and Communications in Medicine (DICOM) format image data are available to nonaffiliated external healthcare facilities or entities on a secure, media free, reciprocally searchable basis with patient authorization for at least a 55-month period after this study. _______________ FINDINGS: EXAM QUALITY: Adequate. PULMONARY ARTERIES: No pulmonary artery filling defect identified. MEDIASTINUM: No significant cardiomegaly.  No pericardial effusion. Posterior mediastinal hernia containing the gastric body and fundus. LUNGS: Numerous bilateral pulmonary nodules, some of which are smaller than at the 02/01/2010 CT though some are new or increased in size. Bibasilar pulmonary atelectasis. PLEURA:  Small left pleural effusion. AIRWAY: Mild generalized bronchial wall thickening. LYMPH NODES:  No enlarged lymph nodes. UPPER ABDOMEN: Described on abdominal pelvic CT performed at same setting. OSSEOUS: No evidence of acute fracture or suspicious bone lesion. OTHER: None. _______________    1. No evidence of acute pulmonary embolus. 2. Herniation of the gastric body and fundus into the posterior mediastinum, similar to prior CT. 3. Numerous bilateral pulmonary nodules, some which are smaller and some larger than at the 2011 comparison CT. Etiology not clear, though differential includes metastatic disease. Signed By: Rosalin Hawking, MD on 08/02/2022 8:13 PM     No results found.       ED Course as of 08/08/22 1410   Mon Aug 08, 2022   1325 EKG 12 Lead  My independent review of the EKG is sinus rhythm at 98.  Normal axis and intervals.  No significant ST or T wave changes.  No delta wave.  No Brugada morphology.  No QTc prolongation of significance. [SH]   1326 I reviewed the patient's discharge paperwork from her visit to [facility] on 19 December. [SH]   1326   It reveals unremarkable chemistries and hepatic function panel.  Troponin within normal limits, BNP within normal limits, TSH within normal limits, CBC with a hemoglobin of 10.8, otherwise unremarkable.  1 and 3-hour troponin measurements at 14 and 15 respectively.  Negative urinalysis.  Positive COVID-19 test.  CT scan of the abdomen pelvis at that time showed no acute inflammatory process.  CTA of the chest showed no evidence of pulmonary embolism.  Numerous bilateral pulmonary nodules consistent with her history of lung cancer. [SH]   1356 There were today including labs, EKG and chest x-ray are unremarkable.  As noted, patient had an extensive workup for similar symptoms at [facility].  Do not feel that admission is warranted.  I will reassure her  and have her follow-up with primary care. [SH]   1408 Went back  to speak with the patient, she was nervous  about going home.  I reassured her by reviewing the evaluation previously and today.  I informed her that I thought it was safe for her to go home today.  She is instructed to follow-up with primary care in the coming week. [SH]      ED Course User Index  [SH] Megan Salon, MD       PROCEDURES   Unless otherwise noted below, none     Procedures    CRITICAL CARE TIME   N/A    CONSULTS:  None    EMERGENCY DEPARTMENT COURSE and DIFFERENTIAL DIAGNOSIS/MDM:   Vitals:    Vitals:    08/08/22 1243   BP: 135/60   Pulse: 99   Resp: 18   Temp: 98.2 F (36.8 C)   TempSrc: Oral   SpO2: 96%   Weight: 81.6 kg (180 lb)   Height: 1.676 m (5\' 6" )       Patient was given the following medications:  Medications   sodium chloride 0.9 % bolus 1,000 mL (1,000 mLs IntraVENous New Bag 08/08/22 1313)            The patient tolerated their visit well.   Thepatient and / or the family were informed of the results of any tests, a time was given to answer questions.    I am the Primary Clinician of Record.    FINAL IMPRESSION      1. Lightheadedness    2. COVID-19        DISPOSITION/PLAN   DISPOSITION Decision To Discharge 08/08/2022 02:08:41 PM      PATIENT REFERRED TO:  Your primary care provider    Schedule an appointment as soon as possible for a visit in 1 week        DISCHARGE MEDICATIONS:  New Prescriptions    No medications on file       DISCONTINUED MEDICATIONS:  Discontinued Medications    No medications on file              (Please note that portions of this note were completed with a voice recognition program.  Efforts were made to edit the dictations but occasionally words aremis-transcribed.)    Megan Salon, MD (electronically signed)          Megan Salon, MD  08/08/22 1410

## 2022-08-08 NOTE — ED Triage Notes (Addendum)
Chief Complaint   Patient presents with    Dizziness       Patient came in due to lightheadedness upon standing up from lying down when she tried to go to the bathroom. Patient stated feeling weak for a month and complaining that her heart is racing.  Patient was seen at [facility] last Dec. 19 and was diagnosed with COVID-19 and Dehydration.      Known case of:  Lung cancer stage 4  Hypertension  GERD  Vertigo  Anemia

## 2022-08-08 NOTE — Discharge Instructions (Signed)
Please be sure to stay well-hydrated.  Please call your primary care doctor to arrange follow-up in the coming week.  Return to the emergency department for any concerns

## 2022-08-08 NOTE — ED Notes (Signed)
Called patient's niece and explained patient's condition; she is happy to pick her up.

## 2022-09-09 ENCOUNTER — Emergency Department: Admit: 2022-09-09 | Payer: MEDICARE

## 2022-09-09 ENCOUNTER — Inpatient Hospital Stay
Admit: 2022-09-09 | Discharge: 2022-09-09 | Disposition: A | Discharge status: Home / Self Care | Payer: MEDICARE | Attending: Emergency Medicine

## 2022-09-09 DIAGNOSIS — N390 Urinary tract infection, site not specified: Secondary | ICD-10-CM

## 2022-09-09 DIAGNOSIS — R531 Weakness: Secondary | ICD-10-CM

## 2022-09-09 LAB — BASIC METABOLIC PANEL
Anion Gap: 4 mmol/L (ref 3.0–18)
BUN/Creatinine Ratio: 8 — ABNORMAL LOW (ref 12–20)
BUN: 10 MG/DL (ref 7.0–18)
CO2: 29 mmol/L (ref 21–32)
Calcium: 9 MG/DL (ref 8.5–10.1)
Chloride: 105 mmol/L (ref 100–111)
Creatinine: 1.21 MG/DL (ref 0.6–1.3)
Est, Glom Filt Rate: 48 mL/min/{1.73_m2} — ABNORMAL LOW (ref >60)
Glucose: 114 mg/dL — ABNORMAL HIGH (ref 74–99)
Potassium: 3.3 mmol/L — ABNORMAL LOW (ref 3.5–5.5)
Sodium: 138 mmol/L (ref 136–145)

## 2022-09-09 LAB — CBC WITH AUTO DIFFERENTIAL
Basophils %: 0 % (ref 0–2)
Basophils Absolute: 0 10*3/uL (ref 0.0–0.1)
Eosinophils %: 2 % (ref 0–5)
Eosinophils Absolute: 0.2 10*3/uL (ref 0.0–0.4)
Hematocrit: 34 % — ABNORMAL LOW (ref 35.0–45.0)
Hemoglobin: 10.1 g/dL — ABNORMAL LOW (ref 12.0–16.0)
Immature Granulocytes %: 0 % (ref 0.0–0.5)
Immature Granulocytes Absolute: 0 10*3/uL (ref 0.00–0.04)
Lymphocytes %: 19 % — ABNORMAL LOW (ref 21–52)
Lymphocytes Absolute: 1.7 10*3/uL (ref 0.9–3.6)
MCH: 23.3 PG — ABNORMAL LOW (ref 24.0–34.0)
MCHC: 29.7 g/dL — ABNORMAL LOW (ref 31.0–37.0)
MCV: 78.3 FL (ref 78.0–100.0)
MPV: 10.1 FL (ref 9.2–11.8)
Monocytes %: 5 % (ref 3–10)
Monocytes Absolute: 0.5 10*3/uL (ref 0.05–1.2)
Neutrophils %: 74 % — ABNORMAL HIGH (ref 40–73)
Neutrophils Absolute: 6.7 10*3/uL (ref 1.8–8.0)
Nucleated RBCs: 0 PER 100 WBC
Platelets: 362 10*3/uL (ref 135–420)
RBC: 4.34 M/uL (ref 4.20–5.30)
RDW: 18.3 % — ABNORMAL HIGH (ref 11.6–14.5)
WBC: 9.2 10*3/uL (ref 4.6–13.2)
nRBC: 0 10*3/uL (ref 0.00–0.01)

## 2022-09-09 LAB — URINALYSIS
Bilirubin Urine: NEGATIVE
Blood, Urine: NEGATIVE
Glucose, UA: NEGATIVE mg/dL
Nitrite, Urine: NEGATIVE
Protein, UA: NEGATIVE mg/dL
Specific Gravity, UA: 1.027 (ref 1.005–1.030)
Urobilinogen, Urine: 0.2 EU/dL (ref 0.2–1.0)
pH, Urine: 6.5 (ref 5.0–8.0)

## 2022-09-09 LAB — URINALYSIS, MICRO: WBC, UA: 6 /hpf (ref 0–4)

## 2022-09-09 LAB — TROPONIN: Troponin, High Sensitivity: 7 ng/L (ref 0–54)

## 2022-09-09 MED ORDER — IOPAMIDOL 76 % IV SOLN
76 % | Freq: Once | INTRAVENOUS | Status: AC | PRN
Start: 2022-09-09 — End: 2022-09-09
  Administered 2022-09-09: 16:00:00 100 mL via INTRAVENOUS

## 2022-09-09 MED ORDER — CEPHALEXIN 500 MG PO CAPS
500 MG | ORAL_CAPSULE | Freq: Two times a day (BID) | ORAL | 0 refills | Status: DC
Start: 2022-09-09 — End: 2022-09-09

## 2022-09-09 MED ORDER — STERILE WATER FOR INJECTION (MIXTURES ONLY)
1 g | INTRAMUSCULAR | Status: AC
Start: 2022-09-09 — End: 2022-09-09
  Administered 2022-09-09: 18:00:00 650 mg via INTRAVENOUS

## 2022-09-09 MED ORDER — POTASSIUM BICARB-CITRIC ACID 20 MEQ PO TBEF
20 MEQ | ORAL | Status: AC
Start: 2022-09-09 — End: 2022-09-09
  Administered 2022-09-09: 18:00:00 40 meq via ORAL

## 2022-09-09 MED ORDER — CEPHALEXIN 500 MG PO CAPS
500 MG | ORAL_CAPSULE | Freq: Two times a day (BID) | ORAL | 0 refills | Status: AC
Start: 2022-09-09 — End: 2022-09-16

## 2022-09-09 MED FILL — EFFER-K 20 MEQ PO TBEF: 20 MEQ | ORAL | Qty: 2

## 2022-09-09 MED FILL — ISOVUE-370 76 % IV SOLN: 76 % | INTRAVENOUS | Qty: 100

## 2022-09-09 MED FILL — CEFTRIAXONE SODIUM 1 G IJ SOLR: 1 g | INTRAMUSCULAR | Qty: 1000

## 2022-09-09 NOTE — ED Triage Notes (Signed)
72 y/o female with Stage 4 lung cancer. C/c increased generalized weakness, hypertension (did not take meds today). 98% of room air. Pt was receiving tx in [place] but has relocated to [place] and has not yet transferred to a new provider. States they found a new mass on her kidney recently via CT scan.

## 2022-09-09 NOTE — Discharge Instructions (Signed)
Please call the oncologist office and primary care doctor's office to establish care. Take the prescribed antibiotics for your urinary tract infection.

## 2022-09-09 NOTE — ED Notes (Signed)
Pt attached to the monitor. Techs attempting to obtain IV access.

## 2022-09-09 NOTE — ED Provider Notes (Signed)
EMERGENCY DEPARTMENT HISTORY AND PHYSICAL EXAM      Date: 09/09/2022  Patient Name: Renee Mcgee    History of Presenting Illness     Chief Complaint   Patient presents with    Fatigue       Patient is a 72 year old female with past medical history of stage IV lung cancer,, hypertension presenting to the emergency department with complaints of generalized weakness.  Reports that this has been an ongoing process for the past couple of weeks.  She is currently in the process of moving from [place] to [place] and has not yet established any care here.  She states that she was not receiving any treatment for her lung cancer back in [place].            PCP: No primary care provider on file.    No current facility-administered medications for this encounter.     Current Outpatient Medications   Medication Sig Dispense Refill    cephALEXin (KEFLEX) 500 MG capsule Take 1 capsule by mouth 2 times daily for 7 days 14 capsule 0       Past History     Past Medical History:  No past medical history on file.    Past Surgical History:  No past surgical history on file.    Family History:  No family history on file.    Social History:       Allergies:  No Known Allergies      Review of Systems       Review of Systems   Constitutional:  Positive for fatigue. Negative for activity change and fever.   Respiratory:  Negative for chest tightness and shortness of breath.    Cardiovascular:  Negative for chest pain.   Gastrointestinal:  Negative for abdominal pain, diarrhea, nausea and vomiting.   Musculoskeletal:  Negative for arthralgias and myalgias.   Skin:  Negative for rash and wound.   Neurological:  Negative for dizziness, weakness, light-headedness, numbness and headaches.   Psychiatric/Behavioral:  Negative for agitation.          Physical Exam   BP (!) 157/78   Pulse 81   Resp 13   Ht 1.676 m (5\' 6" )   Wt 77.1 kg (170 lb)   SpO2 96%   BMI 27.44 kg/m       Physical Exam  Constitutional:       General: She is not in acute  distress.     Appearance: She is not ill-appearing.   HENT:      Head: Normocephalic and atraumatic.      Mouth/Throat:      Mouth: Mucous membranes are moist.   Eyes:      Extraocular Movements: Extraocular movements intact.      Pupils: Pupils are equal, round, and reactive to light.   Cardiovascular:      Rate and Rhythm: Normal rate and regular rhythm.   Pulmonary:      Effort: Pulmonary effort is normal.      Breath sounds: Normal breath sounds.   Abdominal:      General: Abdomen is flat.      Palpations: Abdomen is soft.      Tenderness: There is no abdominal tenderness.   Musculoskeletal:         General: No swelling or deformity. Normal range of motion.      Cervical back: Normal range of motion and neck supple.   Skin:     General: Skin is warm  and dry.      Capillary Refill: Capillary refill takes less than 2 seconds.   Neurological:      General: No focal deficit present.      Mental Status: She is alert and oriented to person, place, and time.      Cranial Nerves: No cranial nerve deficit.      Sensory: No sensory deficit.      Motor: No weakness.   Psychiatric:         Mood and Affect: Mood normal.           Diagnostic Study Results     Labs -  Recent Results (from the past 12 hour(s))   CBC with Auto Differential    Collection Time: 09/09/22  9:05 AM   Result Value Ref Range    WBC 9.2 4.6 - 13.2 K/uL    RBC 4.34 4.20 - 5.30 M/uL    Hemoglobin 10.1 (L) 12.0 - 16.0 g/dL    Hematocrit 56.4 (L) 35.0 - 45.0 %    MCV 78.3 78.0 - 100.0 FL    MCH 23.3 (L) 24.0 - 34.0 PG    MCHC 29.7 (L) 31.0 - 37.0 g/dL    RDW 33.2 (H) 95.1 - 14.5 %    Platelets 362 135 - 420 K/uL    MPV 10.1 9.2 - 11.8 FL    Nucleated RBCs 0.0 0 PER 100 WBC    nRBC 0.00 0.00 - 0.01 K/uL    Neutrophils % 74 (H) 40 - 73 %    Lymphocytes % 19 (L) 21 - 52 %    Monocytes % 5 3 - 10 %    Eosinophils % 2 0 - 5 %    Basophils % 0 0 - 2 %    Immature Granulocytes 0 0.0 - 0.5 %    Neutrophils Absolute 6.7 1.8 - 8.0 K/UL    Lymphocytes Absolute 1.7 0.9  - 3.6 K/UL    Monocytes Absolute 0.5 0.05 - 1.2 K/UL    Eosinophils Absolute 0.2 0.0 - 0.4 K/UL    Basophils Absolute 0.0 0.0 - 0.1 K/UL    Absolute Immature Granulocyte 0.0 0.00 - 0.04 K/UL    Differential Type AUTOMATED     Basic Metabolic Panel    Collection Time: 09/09/22  9:05 AM   Result Value Ref Range    Sodium 138 136 - 145 mmol/L    Potassium 3.3 (L) 3.5 - 5.5 mmol/L    Chloride 105 100 - 111 mmol/L    CO2 29 21 - 32 mmol/L    Anion Gap 4 3.0 - 18 mmol/L    Glucose 114 (H) 74 - 99 mg/dL    BUN 10 7.0 - 18 MG/DL    Creatinine 8.84 0.6 - 1.3 MG/DL    Bun/Cre Ratio 8 (L) 12 - 20      Est, Glom Filt Rate 48 (L) >60 ml/min/1.9m2    Calcium 9.0 8.5 - 10.1 MG/DL   Troponin    Collection Time: 09/09/22  9:05 AM   Result Value Ref Range    Troponin, High Sensitivity 7 0 - 54 ng/L   Urinalysis    Collection Time: 09/09/22 11:42 AM   Result Value Ref Range    Color, UA YELLOW      Appearance CLEAR      Specific Gravity, UA 1.027 1.005 - 1.030      pH, Urine 6.5 5.0 - 8.0      Protein, UA  Negative NEG mg/dL    Glucose, UA Negative NEG mg/dL    Ketones, Urine TRACE (A) NEG mg/dL    Bilirubin Urine Negative NEG      Blood, Urine Negative NEG      Urobilinogen, Urine 0.2 0.2 - 1.0 EU/dL    Nitrite, Urine Negative NEG      Leukocyte Esterase, Urine MODERATE (A) NEG     Urinalysis, Micro    Collection Time: 09/09/22 11:42 AM   Result Value Ref Range    WBC, UA 6 to 8 0 - 4 /hpf    RBC, UA NONE 0 - 5 /hpf    Epithelial Cells UA 1+ 0 - 5 /lpf    BACTERIA, URINE 1+ (A) NEG /hpf       Radiologic Studies -   Non-plain film images such as CT, Ultrasound and MRI are read by the radiologist. Plain radiographic images are visualized and preliminarily interpreted by the emergency physician.    CTA CHEST W WO CONTRAST   Final Result   1.  No acute pulmonary embolism.   2.  Multiple bilateral lung metastases.   3.  Moderate to large size sliding hiatal hernia.   Please refer to the CT abdomen and pelvis study dictated separately.                CT ABDOMEN PELVIS W IV CONTRAST Additional Contrast? None   Final Result   1.  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   2.  Left kidney posterior interpolar region 0.7 cm and RIGHT kidney anterior   lower pole 0.8 cm hypoenhancing lesions-indeterminate; neoplasm cannot be   excluded (nonclear cell neoplasm). If further characterization is desired, MRI   with intravenous contrast may be considered.   3.  Multiple lung metastases as described on CT chest performed concomitantly   may be due to renal primary versus lung primary.            XR CHEST 1 VIEW   Final Result    IMPRESSION:   1. No acute cardiopulmonary process. No significant interval change.                 Medical Decision Making   I am the first provider for this patient.    I reviewed the vital signs, available nursing notes, past medical history, past surgical history, family history and social history.      Vital Signs-Reviewed the patient's vital signs.    EKG: All EKG's are interpreted by the Emergency Department Physician who either signs or Co-signs this chart in the absence of a cardiologist.               Interpretation per the Radiologist below, if available at the time of this note:    ED Course: Progress Notes, Reevaluation, and Consults:    Provider Notes (Medical Decision Making):       MDM  Number of Diagnoses or Management Options  Generalized weakness  Hypokalemia  Urinary tract infection without hematuria, site unspecified  Diagnosis management comments: Patient appears in no acute distress.  Her vital signs are stable.  96% O2 saturation on room air.  Lungs clear to auscultation.  Her lab work is remarkable for mild hypokalemia potassium 3.3.  This was replenished orally.  Urinalysis showing a mild urinary tract infection which we will treat with  Rocephin and Keflex outpatient.  CTA and  CT abdomen obtained.  No acute intra-abdominal findings.  No pneumonia, no PE does demonstrate multiple bilateral lung metastases.  Patient updated on findings and instructed to follow-up with primary care and oncology.  She has been provided the contact information for PCP and oncology here.  Follow-up and return precautions advised.  Patient verbalizes understanding and agreement with plan.  Stable for discharge.                Procedures          Diagnosis     Clinical Impression:   1. Generalized weakness    2. Hypokalemia    3. Urinary tract infection without hematuria, site unspecified        Disposition: discharged    Disclaimer: Sections of this note are dictated using utilizing voice recognition software.  Minor typographical errors may be present. If questions arise, please do not hesitate to contact me or call our department.         Jefm Miles, DO  09/09/22 1258

## 2022-11-24 ENCOUNTER — Encounter: Attending: Urology

## 2022-11-24 DIAGNOSIS — N289 Disorder of kidney and ureter, unspecified: Secondary | ICD-10-CM

## 2022-11-24 NOTE — Progress Notes (Deleted)
Renee Mcgee  04/19/1951        No chief complaint on file.      ICD-10-CM    1. Lesion of both native kidneys  N28.9       2. History of lung cancer  Z85.118       3. Lung mass  R91.8            ASSESSMENT:    1. 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  Left kidney posterior interpolar region 0.7 cm and RIGHT kidney anterior lower pole 0.8 cm hypoenhancing lesions-indeterminate; neoplasm cannot be  excluded (nonclear cell neoplasm).     2. Lung masses concerning for metastases w/ renal primary vs lung primary.    CTA Chest 09/09/22-  Multiple, too numerous to count, bilateral lung variable sized  nodules, consistent with metastases (known history of stage IV lung cancer). Not  confidently able to identify the primary lung cancer in current study.  Mild compression atelectasis of the LEFT lung lower lobe due to hiatal hernia.    3. History  of stage IV lung cancer per CTA Chest (09/09/22)        PLAN:    Reviewed CT A/P w/ contrast  and CTA Chest from 09/09/22  Reviewed hospital notes from 09/09/22      Patient needs continued care for her long term condititions.     HISTORY OF PRESENT ILLNESS:  Renee Mcgee is a 72 y.o. female who is seen in consultation for bilateral renal lesions with pulmonary nodules. Initial imaging performed in ER 09/09/22 2/2 generalized weakness with history of stage IV lung cancer. Patient was receiving treatment in [place] until moving to [place].    No past medical history on file.    No past surgical history on file.         No Known Allergies    No family history on file.    No current outpatient medications on file.     No current facility-administered medications for this visit.           PHYSICAL EXAMINATION:     There were no vitals taken for this visit.  Constitutional: Well developed, well-nourished female in no  acute distress.   CV:  No peripheral swelling noted  Respiratory: No respiratory distress or difficulties  Abdomen:  Soft and nontender. No masses. No hepatosplenomegaly.   GU Female:  No CVA tenderness.     Skin:  Normal color. No evidence of jaundice.     Neuro/Psych:  Patient with appropriate affect.  Alert and oriented.    Lymphatic:   No enlargement of supraclavicular lymph nodes.      REVIEW OF LABS AND IMAGING:      No results found for this visit on 11/24/22.    CTA Chest 09/09/22     FINDINGS:  PULMONARY ARTERIES: No filling defects are appreciated within the main, lobar or  visualized segmental pulmonary arteries to suggest embolism.  HEART STRAIN ASSESSMENT:  -  RV/LV ratio (normal <0.9): Normal  -  Dysfunction or bowing of interventricular septum: None  -  Main pulmonary artery enlargement (>3.0 cm): Not present  -  There is no contrast reflux from the right heart into the IVC/hepatic veins.  CARDIOVASCULAR: No aortic aneurysm or dissection. Unremarkable for age.  LINES AND TUBES: None  BASE  OF NECK: Unremarkable.  LYMPH NODES: Unremarkable.   MEDIASTINUM: Moderate to large-size sliding hiatal hernia with adjacent  abdominal fat.  CHEST WALL: Right breast is not completely included in the field-of-view.  PLEURA: Unremarkable.  LUNG AND AIRWAY:  Multiple, too numerous to count, bilateral lung variable sized  nodules, consistent with metastases (known history of stage IV lung cancer). Not  confidently able to identify the primary lung cancer in current study.  Mild compression atelectasis of the LEFT lung lower lobe due to hiatal hernia.  UPPER ABDOMEN: Please refer to the CT abdomen and pelvis study dictated  separately.        IMPRESSION:  1.  No acute pulmonary embolism.  2.  Multiple bilateral lung metastases.  3.  Moderate to large size sliding hiatal hernia.    CT A/P 09/09/22  FINDINGS:   LOWER CHEST: Please refer to the CT chest study dictated separately.  LIVER: Unremarkable.  BILIARY/GALLBLADDER:  Cholecystectomy.  SPLEEN: Unremarkable.  PANCREAS: Unremarkable.  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  KIDNEYS: Left kidney upper pole partially exophytic 1.8 x 2.2 x 1.9 cm  heterogeneously avidly enhancing mass (6:25).  Left kidney anterior interpolar region 0.7 x 1.1 x 1.2 cm partially exophytic  heterogeneously enhancing mass (6:35).  Left kidney posterior interpolar region 0.7 cm (+77 HU) and RIGHT kidney  anterior lower pole 0.8 cm (+35 HU) hypoenhancing soft tissue density lesions  (6:31, 38)-indeterminate.  BLADDER: Unremarkable.  REPRODUCTIVE: Hysterectomy.  BOWEL: Postsurgical changes of RIGHT colectomy. Moderate to large size sliding  hiatal hernia.  GREAT VESSELS/RETROPERITONEUM:  Unremarkable for age.   LYMPH NODES: Unremarkable.   PERITONEAL SPACES: No free fluid or free air.  BODY WALL/ MSK: Unremarkable.     IMPRESSION:  1.  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  2.  Left kidney posterior interpolar region 0.7 cm and RIGHT kidney anterior  lower pole 0.8 cm hypoenhancing lesions-indeterminate; neoplasm cannot be  excluded (nonclear cell neoplasm). If further characterization is desired, MRI  with intravenous contrast may be considered.  3.  Multiple lung metastases as described on CT chest performed concomitantly  may be due to renal primary versus lung primary.      A copy of today's office visit with all pertinent imaging results and labs were sent to the referring physician.        Patient's BMI is out of the normal parameters.  Information about BMI was given and patient was advised to follow-up with their PCP for further management.         Karie Fetch, M.D.  [facility]   Assistant Professor  [facility]    [address]   [place], [place] [address]   Phone: [phone]    Fax: [phone]    [address]  [place], [place] [address]  P: [phone]   F: [phone]          Medical documentation is provided with the assistance of Mauri Brooklyn, note prepped for Mauri Brooklyn on 11/24/2022.

## 2023-07-25 NOTE — Progress Notes (Signed)
 Called pt to sched NPA appt no answer on primary. Called alternate number a female pick up and stated that she was to busy to speak.

## 2023-12-12 ENCOUNTER — Encounter: Payer: Medicare (Managed Care) | Attending: Pulmonary Disease

## 2024-01-04 ENCOUNTER — Encounter: Payer: Medicare (Managed Care) | Attending: Pulmonary Disease

## 2024-01-24 ENCOUNTER — Encounter: Payer: Medicare (Managed Care) | Attending: Pulmonary Disease

## 2024-02-01 ENCOUNTER — Ambulatory Visit: Admit: 2024-02-01 | Discharge: 2024-02-01 | Payer: Medicare (Managed Care) | Attending: Pulmonary Disease

## 2024-02-01 VITALS — BP 184/73 | HR 64 | Temp 97.90000°F | Resp 16 | Wt 186.0 lb

## 2024-02-01 DIAGNOSIS — R918 Other nonspecific abnormal finding of lung field: Secondary | ICD-10-CM

## 2024-02-01 NOTE — Progress Notes (Signed)
 Renee Mcgee presents today for   Chief Complaint   Patient presents with    New Patient     Referred by NP Eligah Och for Malignant neoplasm of Respiratory tract       Is someone accompanying this pt? No    Is the patient using any DME equipment during OV?     -DME Company     Depression Screening:         No data to display                Learning Needs Questionnaire:     No question data found.      Fall Risk:          No data to display                 Abuse Screening:          No data to display                  Coordination of Care:    1. Have you been to the ER, urgent care clinic since your last visit? Hospitalized since your last visit?     2. Have you seen or consulted any other health care providers outside of the [facility] since your last visit? Include any pap smears or colon screening. PCP    Medication list has been update per patient.

## 2024-02-01 NOTE — Progress Notes (Signed)
 Renee Mcgee (DOB:  [birth year]) is a 73 y.o. female,New patient, here for evaluation of the following chief complaint(s):  New Patient (Referred by NP Eligah Och for Malignant neoplasm of Respiratory tract)         Assessment & Plan  Lung nodules       Orders:    CT CHEST ABDOMEN PELVIS W WO CONTRAST Additional Contrast? Radiologist Recommendation; Future    Renal mass       Orders:    CT CHEST ABDOMEN PELVIS W WO CONTRAST Additional Contrast? 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  Pt still declines therapy but agrees to surveillance scans.   She does agree to further workup should the next scan have findings concerning for RCC. This would include a biopsy if indicated.   Pt encouraged to continue PT/OT for long COVID.   Consider PFT's in the future should SOB persist despite completing PT/OT.   Further intervention pending test results.    Return in about 3 months (around 05/03/2024).       Subjective   Pt is a 73/F referred to establish care for lung nodules. Pt with lung nodules diagnosed as secondary endocrine tumors in 2011 by lung biopsy done at [facility]. Pt was followed by Oncology at that time but declined any therapy. She then moved to [facility] where she was followed by Pulmonology and Oncology and surveillance scans showed stable lung nodules. In 2023, pt visited family here in [place] and contracted COVID, this resulted in multiple deficits, landing pt in SNF, briefly on Hospice.   Review of outside scans show persistent but mostly stable lung nodules, and renal abnormalities concerning for RCC. Last CT below, done in early 2024 has similar findings. Of note, pt did not follow up with Urology for renal masses.   Respiratory symptoms include SOB with mild exertion. Denies chest pain, daily cough or wheezing.   Pt denies gross hematuria,  dysuria, flank pain.           Review of Systems   Constitutional:  Positive for activity change and fatigue. Negative for appetite change, chills, diaphoresis, fever and unexpected weight change.   HENT:  Negative for congestion, facial swelling, hearing loss, nosebleeds, postnasal drip, sinus pain, sneezing, sore throat, tinnitus, trouble swallowing and voice change.    Eyes:  Negative for photophobia, pain, discharge, redness, itching and visual disturbance.   Respiratory:  Positive for shortness of breath. Negative for apnea, choking, chest tightness, wheezing and stridor. Cough: rare.   Cardiovascular:  Negative for chest pain, palpitations and leg swelling.   Gastrointestinal:  Negative for abdominal pain, blood in stool, diarrhea, nausea and vomiting.   Endocrine: Negative for cold intolerance, heat intolerance, polydipsia, polyphagia and polyuria.   Genitourinary:  Negative for dysuria, flank pain, hematuria and urgency.   Musculoskeletal:  Positive for arthralgias and back pain. Negative for gait problem, joint swelling, myalgias, neck pain and neck stiffness.   Skin:  Negative for color change, pallor, rash and wound.   Allergic/Immunologic: Negative for environmental allergies, food allergies and immunocompromised state.   Neurological:  Negative for tremors, seizures, syncope, speech difficulty, weakness, light-headedness, numbness and headaches.   Hematological:  Negative for adenopathy. Does not bruise/bleed easily.   Psychiatric/Behavioral:  Negative for  confusion, hallucinations, self-injury, sleep disturbance and suicidal ideas. The patient is not nervous/anxious.         Past Medical History:   Diagnosis Date    Hypertension     Long COVID      Past Surgical History:   Procedure Laterality Date    APPENDECTOMY      CHOLECYSTECTOMY  2003    COLECTOMY  2005    HYSTERECTOMY, TOTAL ABDOMINAL (CERVIX REMOVED)  1995     Current Outpatient Medications on File Prior to Visit   Medication Sig Dispense  Refill    amLODIPine (NORVASC) 5 MG tablet Take 1 tablet by mouth daily      FLUoxetine (PROZAC) 20 MG tablet Take 1 tablet by mouth daily      hydrALAZINE (APRESOLINE) 25 MG tablet Take 1 tablet by mouth 3 times daily      metoprolol tartrate (LOPRESSOR) 50 MG tablet Take 1 tablet by mouth 2 times daily      naloxone (NARCAN) 0.4 MG/ML injection       omeprazole (PRILOSEC) 40 MG delayed release capsule Take 1 capsule by mouth daily      meclizine (ANTIVERT) 25 MG tablet Take 1 tablet by mouth 3 times daily as needed      traMADol (ULTRAM) 50 MG tablet Take 1 tablet by mouth every 6 hours as needed for Pain. Max Daily Amount: 200 mg       No current facility-administered medications on file prior to visit.     No Known Allergies  Family History   Problem Relation Age of Onset    Stroke Mother     Heart Failure Father     Stroke Brother      Social History     Socioeconomic History    Marital status: Single     Spouse name: Not on file    Number of children: Not on file    Years of education: Not on file    Highest education level: Not on file   Occupational History    Not on file   Tobacco Use    Smoking status: Never    Smokeless tobacco: Not on file    Tobacco comments:     Second hand smoke from parents   Substance and Sexual Activity    Alcohol use: Not on file    Drug use: Not on file    Sexual activity: Not on file   Other Topics Concern    Not on file   Social History Narrative    Not on file     Social Drivers of Health     Financial Resource Strain: Not on file   Food Insecurity: Not on file   Transportation Needs: Not on file   Physical Activity: Not on file   Stress: Not on file   Social Connections: Not on file   Intimate Partner Violence: Not on file   Housing Stability: Not on file       Objective   Blood pressure (!) 184/73, pulse 64, temperature 97.9 F (36.6 C), temperature source Temporal, resp. rate 16, weight 84.4 kg (186 lb), SpO2 96%.   Physical Exam  Constitutional:       General: She is not  in acute distress.     Appearance: She is not ill-appearing, toxic-appearing or diaphoretic.      Comments: Wheelchair use   HENT:      Head: Normocephalic and atraumatic.      Right Ear: External  ear normal.      Left Ear: External ear normal.      Nose: Nose normal. No congestion or rhinorrhea.      Mouth/Throat:      Mouth: Mucous membranes are moist.      Pharynx: Oropharynx is clear. No oropharyngeal exudate or posterior oropharyngeal erythema.   Eyes:      General: No scleral icterus.        Right eye: No discharge.         Left eye: No discharge.      Conjunctiva/sclera: Conjunctivae normal.      Pupils: Pupils are equal, round, and reactive to light.   Neck:      Vascular: No carotid bruit.   Cardiovascular:      Rate and Rhythm: Normal rate and regular rhythm.      Pulses: Normal pulses.      Heart sounds: Normal heart sounds. No murmur heard.     No gallop.   Pulmonary:      Effort: Pulmonary effort is normal. No respiratory distress.      Breath sounds: Normal breath sounds. No stridor. No wheezing, rhonchi or rales.   Chest:      Chest wall: No tenderness.   Abdominal:      Palpations: Abdomen is soft. There is no mass.      Tenderness: There is no abdominal tenderness.   Musculoskeletal:         General: No swelling, tenderness, deformity or signs of injury.      Cervical back: No rigidity or tenderness.      Right lower leg: No edema.      Left lower leg: No edema.   Lymphadenopathy:      Cervical: No cervical adenopathy.   Skin:     General: Skin is warm and dry.      Coloration: Skin is not jaundiced or pale.      Findings: No bruising, erythema, lesion or rash.   Neurological:      Mental Status: She is alert and oriented to person, place, and time.      Coordination: Coordination normal.      Gait: Gait abnormal (wheelchair use).   Psychiatric:         Mood and Affect: Mood normal.         Behavior: Behavior normal.         Thought Content: Thought content normal.         Judgment: Judgment normal.           CT Result (most recent):  CTA CHEST ABDOMEN PELVIS W CONTRAST 10/05/2022    Narrative  EXAM: CTA CHEST PE/CT ABD/PELV W/CON    CLINICAL INDICATION/HISTORY: Hx of CA, SOB. Also not passing stools    COMPARISON: CT 08/02/2022    TECHNIQUE: CTA of the chest was performed using timing optimized for pulmonary embolism technique, with IV contrast.  To maximize sensitivity the sagittal and coronal reconstructions were created using a 3D multislice MIP (maximal intensity projection) methodology.  This was followed by enhanced CT abdomen and pelvis imaging, with standard coronal and sagittal reconstructions.    CT scans at this facility are performed using dose optimization technique as appropriate to the performed exam, to include automated exposure control, adjustment of the mA and/or kV according to patient size (including appropriate matching for site specific examinations), or use of iterative reconstruction technique.    FINDINGS:    CTA CHEST:    Pulmonary arteries:  No pulmonary artery filling defects identified to suggest pulmonary embolus. No evidence of right heart strain.    Cardiovascular: Thoracic aorta normal in course and caliber. Heart size normal.    Mediastinum: Imaged thyroid unremarkable. No adenopathy by size criteria. Esophagus nondistended. Large mixed sliding and paraesophageal hiatal hernia.    Pleura: No pleural effusion. No pneumothorax.    Lungs/airways: No focal suspicious bronchial lesions. Bilateral lower lung patchy streaky likely atelectasis/scarring. Multiple scattered rounded pulmonary nodules subjectively similar to recent prior CT, right more than left. Respiratory motion limits evaluation.    Miscellaneous: Superficial soft tissues unremarkable.    Bones: No acute osseous findings.    ABDOMEN/PELVIS:    Hepatobiliary: Liver unremarkable. No cholecystectomy or biliary duct dilatation.    Pancreas: Unremarkable.    Spleen: Unremarkable.    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    Genitourinary:  -Right kidney: Unremarkable.  -Left kidney: Heterogeneous likely solid enhancing nodules in the upper pole and smaller similar focus in the anterior mid kidney, subjectively similar to recent CT.  -Bladder/reproductive: Bladder with perhaps mild wall thickening. Hysterectomy.    Gastrointestinal: Large hiatal hernia. Small bowel loops nondilated. The colon is nondilated.    Mesentery/vessel/nodes: No free air. No free fluid. Major vessels unremarkable. No adenopathy by size criteria.    Miscellaneous: Superficial soft tissues unremarkable.    Bones: No acute osseous findings.    Impression  CTA Chest:  -No evidence of pulmonary embolus or right heart strain.  -Multiple scattered pulmonary nodules similar to recent chest CT. Metastatic disease remains a possibility.  -Large mixed sliding/paraesophageal hiatal hernia.    Abdomen/pelvis:  -Left kidney indeterminant possibly solid masses similar to recent CT. Renal cell cancer remains the primary consideration.  -Bladder with perhaps mild wall thickening. Query UTI/cystitis.  -Large mixed sliding/paraesophageal hiatal hernia.  - Left adrenal myelolipoma.    See additional details above.    Signed By: Keller KANDICE Soda, MD on 10/05/2022 5:19 PM                An electronic signature was used to authenticate this note.    --Rea CHRISTELLA Drafts, MD

## 2024-02-12 NOTE — Telephone Encounter (Signed)
 Spoke with Renee Mcgee and advised should be for cancer work up she will correct the order if you will please co-sign it.

## 2024-02-12 NOTE — Telephone Encounter (Signed)
 Spoke with Renee Mcgee in CT dept they want to know if the study is for a cancer work up or are you just wanting to look at the kidney specifically

## 2024-02-12 NOTE — Telephone Encounter (Signed)
 Nancy from [facility] CT called to get more info/clarification on the CT ordered for the pt. CT is scheduled for 02/13/24. Please call back.

## 2024-02-13 ENCOUNTER — Inpatient Hospital Stay: Admit: 2024-02-13 | Payer: Medicare (Managed Care) | Attending: Pulmonary Disease

## 2024-02-13 ENCOUNTER — Encounter

## 2024-02-13 DIAGNOSIS — R918 Other nonspecific abnormal finding of lung field: Principal | ICD-10-CM

## 2024-02-13 LAB — POCT CREATININE AND GFR
POC Creatinine: 1.2 mg/dL (ref 0.6–1.3)
eGFR, POC: 48 mL/min/{1.73_m2} — ABNORMAL LOW (ref >60)

## 2024-02-22 NOTE — Telephone Encounter (Signed)
 Called pt re: CT chest results. Left VM for call back

## 2024-02-23 NOTE — Telephone Encounter (Signed)
 Pt is returning Dr. Fayette Pho call.

## 2024-03-06 NOTE — Telephone Encounter (Signed)
 Pt is calling again in regards to her CT results. Please advise [phone]

## 2024-05-08 ENCOUNTER — Encounter: Payer: Medicare (Managed Care) | Attending: Pulmonary Disease
